# Patient Record
Sex: Female | Born: 1960
Health system: Southern US, Community
[De-identification: ages and names within clinical notes are randomized; demographics above are authoritative.]

## PROBLEM LIST (undated history)

## (undated) DIAGNOSIS — R519 Headache, unspecified: Secondary | ICD-10-CM

## (undated) DIAGNOSIS — B019 Varicella without complication: Secondary | ICD-10-CM

## (undated) DIAGNOSIS — R51 Headache: Secondary | ICD-10-CM

## (undated) DIAGNOSIS — K219 Gastro-esophageal reflux disease without esophagitis: Secondary | ICD-10-CM

## (undated) DIAGNOSIS — C801 Malignant (primary) neoplasm, unspecified: Secondary | ICD-10-CM

## (undated) DIAGNOSIS — I10 Essential (primary) hypertension: Secondary | ICD-10-CM

## (undated) DIAGNOSIS — N390 Urinary tract infection, site not specified: Secondary | ICD-10-CM

## (undated) DIAGNOSIS — D3911 Neoplasm of uncertain behavior of right ovary: Secondary | ICD-10-CM

## (undated) DIAGNOSIS — D649 Anemia, unspecified: Secondary | ICD-10-CM

## (undated) HISTORY — DX: Essential (primary) hypertension: I10

## (undated) HISTORY — DX: Varicella without complication: B01.9

## (undated) HISTORY — DX: Gastro-esophageal reflux disease without esophagitis: K21.9

## (undated) HISTORY — DX: Malignant (primary) neoplasm, unspecified: C80.1

## (undated) HISTORY — DX: Urinary tract infection, site not specified: N39.0

---
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
## 2006-05-11 ENCOUNTER — Ambulatory Visit: Payer: Self-pay | Admitting: Unknown Physician Specialty

## 2007-05-15 ENCOUNTER — Ambulatory Visit: Payer: Self-pay | Admitting: Unknown Physician Specialty

## 2007-07-07 ENCOUNTER — Emergency Department: Payer: Self-pay | Admitting: Emergency Medicine

## 2008-05-23 ENCOUNTER — Ambulatory Visit: Payer: Self-pay | Admitting: Unknown Physician Specialty

## 2009-05-25 ENCOUNTER — Ambulatory Visit: Payer: Self-pay | Admitting: Unknown Physician Specialty

## 2010-02-09 ENCOUNTER — Ambulatory Visit: Payer: Self-pay | Admitting: Orthopedic Surgery

## 2010-03-09 ENCOUNTER — Ambulatory Visit: Payer: Self-pay | Admitting: Family Medicine

## 2010-06-01 ENCOUNTER — Ambulatory Visit: Payer: Self-pay | Admitting: Unknown Physician Specialty

## 2011-01-23 ENCOUNTER — Ambulatory Visit: Payer: Self-pay | Admitting: Family Medicine

## 2011-06-09 ENCOUNTER — Ambulatory Visit: Payer: Self-pay | Admitting: Unknown Physician Specialty

## 2011-08-10 ENCOUNTER — Ambulatory Visit: Payer: Self-pay

## 2011-08-10 LAB — URINALYSIS, COMPLETE
Glucose,UR: NEGATIVE mg/dL (ref 0–75)
Ketone: NEGATIVE
Nitrite: NEGATIVE
Ph: 5 (ref 4.5–8.0)
Specific Gravity: >=1.03 (ref 1.003–1.030)

## 2011-08-11 LAB — URINE CULTURE

## 2013-02-01 ENCOUNTER — Ambulatory Visit: Payer: Self-pay | Admitting: Family Medicine

## 2013-03-27 ENCOUNTER — Ambulatory Visit (INDEPENDENT_AMBULATORY_CARE_PROVIDER_SITE_OTHER): Payer: 59 | Admitting: Internal Medicine

## 2013-03-27 ENCOUNTER — Encounter: Payer: Self-pay | Admitting: Internal Medicine

## 2013-03-27 VITALS — BP 136/80 | HR 74 | Temp 98.2°F | Resp 14 | Ht 67.0 in | Wt 145.5 lb

## 2013-03-27 DIAGNOSIS — Z0000 Encounter for general adult medical examination without abnormal findings: Secondary | ICD-10-CM

## 2013-03-27 NOTE — Progress Notes (Signed)
Patient ID: Carmen Ross, female   DOB: Mar 31, 1961, 52 y.o.   MRN: [id number]   Patient Active Problem List   Diagnosis Date Noted  . Routine general medical examination at a health care facility 03/28/2013    Subjective:  CC:   Chief Complaint  Patient presents with  . Establish Care    HPI:   Carmen Ross is a 52 y.o. female who presents as a new patient to establish primary care with the chief complaint of establish care. Patient was last seen in 2012 at my former practice.  No records are available at time of evaluation    She has been generally healthy but had several illnesses over the last several months which occurred in retrospect per patient, due to the stress of the school year. 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   In June she developed sinus pain and low-grade fevers which did not resolve after a week so she was treated at the Urgent Care in [facility]  for a  sinus infection  With augmentin and flonase.   her treatment for sinusitis was followed by the development of a   UTI treated  which she treated with OTC meds   following resolution of her urinary symptoms, she  developed multiple loose stools daily accompanied by  Post prandial nausea without vomiting. Micah Flesher to [facility] and suffered through it  With bland diet ,  Crackers,  Finally resolved after 10 to 14 days .   she notes that her bowels have returned to normal but she is less lactulose intolerant that she had been in the past.       Past Medical History  Diagnosis Date  . Chicken pox   . UTI (lower urinary tract infection)     Past Surgical History  Procedure Laterality Date  . Hernia repair  1968  . Cesarean section  1988    Family History  Problem Relation Age of Onset  . Arthritis Mother   .  Hypertension Mother   . Hyperlipidemia Father   . Heart disease Father   . Hypertension Father   . Diabetes Father   . Hypertension Maternal Grandmother   . Arthritis Maternal Grandmother   . Hypertension Maternal Grandfather     History   Social History  . Marital Status: Married    Spouse Name: N/A    Number of Children: N/A  . Years of Education: N/A   Occupational History  . Not on file.   Social History Main Topics  . Smoking status: Current Every Day Smoker -- 10.00 packs/day    Types: Cigarettes    Start date: 03/27/1982  . Smokeless tobacco: Never Used  . Alcohol Use: Yes     Comment: occasional very rare  . Drug Use: No  . Sexual Activity: Yes   Other Topics Concern  . Not on file   Social History Narrative  . No narrative on file    No Known Allergies   Review of Systems:   The remainder of the review of systems was negative except those addressed in the HPI.    Objective:  BP 136/80  Pulse 74  Temp(Src) 98.2 F (36.8 C) (Oral)  Resp 14  Ht 5\' 7"  (1.702 m)  Wt 145 lb 8 oz (65.998 kg)  BMI 22.78  kg/m2  SpO2 98%  LMP 02/19/2013  General appearance: alert, cooperative and appears stated age Ears: normal TM's and external ear canals both ears Throat: lips, mucosa, and tongue normal; teeth and gums normal Neck: no adenopathy, no carotid bruit, supple, symmetrical, trachea midline and thyroid not enlarged, symmetric, no tenderness/mass/nodules Back: symmetric, no curvature. ROM normal. No CVA tenderness. Lungs: clear to auscultation bilaterally Heart: regular rate and rhythm, S1, S2 normal, no murmur, click, rub or gallop Abdomen: soft, non-tender; bowel sounds normal; no masses,  no organomegaly Pulses: 2+ and symmetric Skin: Skin color, texture, turgor normal. No rashes or lesions Lymph nodes: Cervical, supraclavicular, and axillary nodes normal.  Assessment and Plan:  Routine general medical examination at a health care facility Annual  comprehensive exam was done excluding breast, pelvic and PAP smear. All screenings have been addressed . She is up-to-date and does not need a Pap smear until October 2015.   Updated Medication List Outpatient Encounter Prescriptions as of 03/27/2013  Medication Sig Dispense Refill  . Multiple Vitamins-Minerals (MULTIVITAMIN WITH MINERALS) tablet Take 1 tablet by mouth daily.       No facility-administered encounter medications on file as of 03/27/2013.     Orders Placed This Encounter  Procedures  . HM MAMMOGRAPHY  . HM PAP SMEAR    No Follow-up on file.

## 2013-03-27 NOTE — Patient Instructions (Addendum)
Return at your leisure for fasting labs  Consider daily use of Simply Saline to flush sinuses after work

## 2013-03-28 ENCOUNTER — Encounter: Payer: Self-pay | Admitting: Internal Medicine

## 2013-03-28 DIAGNOSIS — Z0000 Encounter for general adult medical examination without abnormal findings: Secondary | ICD-10-CM | POA: Insufficient documentation

## 2013-03-28 NOTE — Assessment & Plan Note (Signed)
Annual comprehensive exam was done excluding breast, pelvic and PAP smear. All screenings have been addressed . She is up-to-date and does not need a Pap smear until October 2015.

## 2013-04-02 ENCOUNTER — Other Ambulatory Visit: Payer: 59

## 2013-07-02 ENCOUNTER — Ambulatory Visit: Payer: 59 | Admitting: Adult Health

## 2013-07-03 ENCOUNTER — Ambulatory Visit (INDEPENDENT_AMBULATORY_CARE_PROVIDER_SITE_OTHER): Payer: 59 | Admitting: Internal Medicine

## 2013-07-03 ENCOUNTER — Ambulatory Visit: Payer: 59 | Admitting: Adult Health

## 2013-07-03 ENCOUNTER — Encounter: Payer: Self-pay | Admitting: Internal Medicine

## 2013-07-03 VITALS — BP 124/74 | HR 74 | Temp 98.0°F | Resp 12 | Ht 67.0 in | Wt 151.0 lb

## 2013-07-03 DIAGNOSIS — Z720 Tobacco use: Secondary | ICD-10-CM

## 2013-07-03 DIAGNOSIS — Z7189 Other specified counseling: Secondary | ICD-10-CM

## 2013-07-03 DIAGNOSIS — J069 Acute upper respiratory infection, unspecified: Secondary | ICD-10-CM

## 2013-07-03 DIAGNOSIS — F17200 Nicotine dependence, unspecified, uncomplicated: Secondary | ICD-10-CM

## 2013-07-03 DIAGNOSIS — Z716 Tobacco abuse counseling: Secondary | ICD-10-CM

## 2013-07-03 MED ORDER — AMOXICILLIN-POT CLAVULANATE 875-125 MG PO TABS: 1.0000 | ORAL_TABLET | Freq: Two times a day (BID) | ORAL | Status: DC

## 2013-07-03 MED ORDER — BENZONATATE 200 MG PO CAPS: 200.0000 mg | ORAL_CAPSULE | Freq: Three times a day (TID) | ORAL | Status: DC | PRN

## 2013-07-03 NOTE — Progress Notes (Signed)
Pre-visit discussion using our clinic review tool. No additional management support is needed unless otherwise documented below in the visit note.  

## 2013-07-03 NOTE — Patient Instructions (Addendum)
You have a viral  Syndrome .  The post nasal drip is causing your sore/scratchy throat.  Lavage your sinuses twice daily with Simply saline nasal spray.  Use benadryl 25 mg every 8 hours and Sudafed PE 10 to 30 mg every 8 hours to manage the drainage and congestion.  Gargle with salt water as often for the sore throat.  Use Delsym  For the cough syrup for the cough.  If the throat is no better  In 3 to 4 days OR  if you develop T > 100.4,  Green nasal discharge, ear or facial pain,  You can start the augmentin  Please take a probiotic ( Align, Flora que or [place]) while you are on the antibiotic to prevent a serious antibiotic associated diarrhea  Called clostirudium dificile colitis and a vaginal yeast infection   You can also use the tessalon cough capsules for severe cough

## 2013-07-05 ENCOUNTER — Encounter: Payer: Self-pay | Admitting: Internal Medicine

## 2013-07-05 DIAGNOSIS — J069 Acute upper respiratory infection, unspecified: Secondary | ICD-10-CM | POA: Insufficient documentation

## 2013-07-05 DIAGNOSIS — Z720 Tobacco use: Secondary | ICD-10-CM | POA: Insufficient documentation

## 2013-07-05 DIAGNOSIS — Z716 Tobacco abuse counseling: Secondary | ICD-10-CM | POA: Insufficient documentation

## 2013-07-05 NOTE — Progress Notes (Signed)
Patient ID: Carmen Ross, female   DOB: 1961-03-28, 52 y.o.   MRN: [id number]   Patient Active Problem List   Diagnosis Date Noted  . Acute URI of multiple sites 07/05/2013  . Tobacco abuse counseling 07/05/2013  . Tobacco abuse 07/05/2013  . Routine general medical examination at a health care facility 03/28/2013    Subjective:  CC:   Chief Complaint  Patient presents with  . Acute Visit    sinus pressure , scratchy throat no fever.  . Cough    sometimes productive, yellow mucus    HPI:   Carmen Ross a 52 y.o. female who presents with signs and symptoms of viral URI>  Scratchy throat started a week ago  Accompanied by dry cough.  For the last days having a lot of sinus pressure accompanied by sneezing and rhinorrhea with clear to yellow discharge.  Ears have been "popping" but not painful, no loss of hearing or discharge,  No fevers, nausea.  No recent travel.  Positive sick contacts at [employer].  Patient is a closet smoker .  Past Medical History  Diagnosis Date  . Chicken pox   . UTI (lower urinary tract infection)     Past Surgical History  Procedure Laterality Date  . Hernia repair  1968  . Cesarean section  1988       The following portions of the patient's history were reviewed and updated as appropriate: Allergies, current medications, and problem list.    Review of Systems:   12 Pt  review of systems was negative except those addressed in the HPI,     History   Social History  . Marital Status: Married    Spouse Name: N/A    Number of Children: N/A  . Years of Education: N/A   Occupational History  . Not on file.   Social History Main Topics  . Smoking status: Current Every Day Smoker -- 0.07 packs/day    Types: Cigarettes    Start date: 03/27/1982  . Smokeless tobacco: Never Used  . Alcohol Use: Yes     Comment: occasional very rare  . Drug Use: No  . Sexual Activity: Yes   Other Topics Concern  . Not on file   Social  History Narrative  . No narrative on file    Objective:  Filed Vitals:   07/03/13 1108  BP: 124/74  Pulse: 74  Temp: 98 F (36.7 C)  Resp: 12     General appearance: alert, cooperative and appears stated age Ears: normal TM's and external ear canals both ears Throat: lips, mucosa, and tongue normal; teeth and gums normal Neck: no adenopathy, no carotid bruit, supple, symmetrical, trachea midline and thyroid not enlarged, symmetric, no tenderness/mass/nodules Back: symmetric, no curvature. ROM normal. No CVA tenderness. Lungs: clear to auscultation bilaterally Heart: regular rate and rhythm, S1, S2 normal, no murmur, click, rub or gallop Abdomen: soft, non-tender; bowel sounds normal; no masses,  no organomegaly Pulses: 2+ and symmetric Skin: Skin color, texture, turgor normal. No rashes or lesions Lymph nodes: Cervical, supraclavicular, and axillary nodes normal.  Assessment and Plan:  Acute URI of multiple sites This URI is most likely viral given the failure of mild HEENT exam.  I have explained that in viral URIS, an antibiotic will not help the symptoms and will increase the risk of developing diarrhea.,  Continue oral and nasal decongestants,  Ibuprofen 400 mg and tylenol 650 mq 8 hrs for aches and pains,  And will [employer]  100 mg every 8 hours prn cough  Add abx only if symptoms worsen to include fevers, facial pain, purulent sputum./drainage.   Tobacco abuse counseling The patient was counseled on the dangers of tobacco use, and was advised to quit.  Reviewed strategies to maximize success, including removing cigarettes and smoking materials from environment.   Updated Medication List Outpatient Encounter Prescriptions as of 07/03/2013  Medication Sig  . Multiple Vitamins-Minerals (MULTIVITAMIN WITH MINERALS) tablet Take 1 tablet by mouth daily.  . phenylephrine (SUDAFED PE) 10 MG TABS tablet Take 10 mg by mouth every 4 (four) hours as needed.  Marland Kitchen  amoxicillin-clavulanate (AUGMENTIN) 875-125 MG per tablet Take 1 tablet by mouth 2 (two) times daily.  . benzonatate (TESSALON) 200 MG capsule Take 1 capsule (200 mg total) by mouth 3 (three) times daily as needed for cough.     No orders of the defined types were placed in this encounter.    No Follow-up on file.

## 2013-07-05 NOTE — Assessment & Plan Note (Addendum)
This URI is most likely viral given the failure of mild HEENT exam.  I have explained that in viral URIS, an antibiotic will not help the symptoms and will increase the risk of developing diarrhea.,  Continue oral and nasal decongestants,  Ibuprofen 400 mg and tylenol 650 mq 8 hrs for aches and pains,  And will addtessalon 100 mg every 8 hours prn cough  Add abx only if symptoms worsen to include fevers, facial pain, purulent sputum./drainage.

## 2013-07-05 NOTE — Assessment & Plan Note (Signed)
The patient was counseled on the dangers of tobacco use, and was advised to quit.  Reviewed strategies to maximize success, including removing cigarettes and smoking materials from environment. 

## 2014-08-13 ENCOUNTER — Observation Stay: Payer: Self-pay | Admitting: Specialist

## 2014-08-13 ENCOUNTER — Telehealth: Payer: Self-pay | Admitting: Internal Medicine

## 2014-08-13 LAB — URINALYSIS, COMPLETE
BLOOD: NEGATIVE
Bilirubin,UR: NEGATIVE
Glucose,UR: NEGATIVE mg/dL (ref 0–75)
Leukocyte Esterase: NEGATIVE
Nitrite: NEGATIVE
PROTEIN: NEGATIVE
Ph: 5 (ref 4.5–8.0)
RBC,UR: 1 /HPF (ref 0–5)
SPECIFIC GRAVITY: 1.028 (ref 1.003–1.030)
WBC UR: 1 /HPF (ref 0–5)

## 2014-08-13 LAB — TROPONIN I
TROPONIN-I: 0.16 ng/mL — AB
TROPONIN-I: 0.15 ng/mL — AB

## 2014-08-13 LAB — CK TOTAL AND CKMB (NOT AT ARMC)
CK, TOTAL: 60 U/L (ref 26–192)
CK, Total: 46 U/L (ref 26–192)
CK-MB: 1.5 ng/mL (ref 0.5–3.6)
CK-MB: 1.1 ng/mL (ref 0.5–3.6)

## 2014-08-13 LAB — BASIC METABOLIC PANEL
ANION GAP: 7 (ref 7–16)
BUN: 11 mg/dL (ref 7–18)
CALCIUM: 8.8 mg/dL (ref 8.5–10.1)
CHLORIDE: 106 mmol/L (ref 98–107)
CO2: 26 mmol/L (ref 21–32)
Creatinine: 0.78 mg/dL (ref 0.60–1.30)
EGFR (African American): >60
EGFR (Non-African Amer.): >60
Glucose: 89 mg/dL (ref 65–99)
OSMOLALITY: 276 (ref 275–301)
POTASSIUM: 3.7 mmol/L (ref 3.5–5.1)
Sodium: 139 mmol/L (ref 136–145)

## 2014-08-13 LAB — CBC WITH DIFFERENTIAL/PLATELET
Basophil #: 0.1 10*3/uL (ref 0.0–0.1)
Basophil %: 0.6 %
EOS PCT: 1.5 %
Eosinophil #: 0.1 10*3/uL (ref 0.0–0.7)
HCT: 27.9 % — AB (ref 35.0–47.0)
HGB: 8.5 g/dL — ABNORMAL LOW (ref 12.0–16.0)
Lymphocyte #: 1.8 10*3/uL (ref 1.0–3.6)
Lymphocyte %: 20.1 %
MCH: 20.2 pg — ABNORMAL LOW (ref 26.0–34.0)
MCHC: 30.5 g/dL — ABNORMAL LOW (ref 32.0–36.0)
MCV: 66 fL — ABNORMAL LOW (ref 80–100)
MONOS PCT: 6.2 %
Monocyte #: 0.6 x10 3/mm (ref 0.2–0.9)
NEUTROS ABS: 6.5 10*3/uL (ref 1.4–6.5)
Neutrophil %: 71.6 %
Platelet: 308 10*3/uL (ref 150–440)
RBC: 4.2 10*6/uL (ref 3.80–5.20)
RDW: 18 % — ABNORMAL HIGH (ref 11.5–14.5)
WBC: 9.1 10*3/uL (ref 3.6–11.0)

## 2014-08-13 NOTE — Telephone Encounter (Signed)
Patient Name: Carmen Ross  DOB: 02/15/1961    Nurse Assessment  Nurse: Mallie Mussel, RN, Alveta Heimlich Date/Time Eilene Ghazi Time): 08/13/2014 1:01:19 PM  Confirm and document reason for call. If symptomatic, describe symptoms. ---Caller states that she felt faint and dizzy when she first got up this morning. This lasted about 45 minutes to an hour. She has tingling in her fingers and toes. Denies difficulty breathing. She just "feels weird, like something is going on." She no longer feels dizzy. The left face of her face feels funny, but not numb. "it feels different" Denies difficulty walking, she is not slurring her words. Denies headache.  Has the patient traveled out of the country within the last 30 days? ---No  Does the patient require triage? ---Yes  Related visit to physician within the last 2 weeks? ---No  Does the PT have any chronic conditions? (i.e. diabetes, asthma, etc.) ---No  Did the patient indicate they were pregnant? ---No     Guidelines    Guideline Title Affirmed Question Affirmed Notes  Neurologic Deficit [1] Numbness (i.e., loss of sensation) of the face, arm or leg on one side of the body AND [2] gradual onset (e.g., days to weeks) AND [3] present now    Final Disposition User   See Physician within 4 Hours (or PCP triage) Mallie Mussel, RN, Alveta Heimlich

## 2014-08-13 NOTE — Telephone Encounter (Signed)
Please review in Dr Lupita Dawn absence

## 2014-08-13 NOTE — Telephone Encounter (Signed)
Called pt in attempt to advise her to go to ER or Urgent Care for evaluation however there was no answer.  Left message for pt to return call

## 2014-08-13 NOTE — Telephone Encounter (Signed)
Please see if she can be seen elsewhere since this will be longer than a 15 minute visit. Thanks!

## 2014-08-14 ENCOUNTER — Telehealth: Payer: Self-pay | Admitting: Internal Medicine

## 2014-08-14 DIAGNOSIS — I340 Nonrheumatic mitral (valve) insufficiency: Secondary | ICD-10-CM

## 2014-08-14 LAB — LIPID PANEL
Cholesterol: 157 mg/dL
HDL Cholesterol: 64 mg/dL — ABNORMAL HIGH
Ldl Cholesterol, Calc: 80 mg/dL
Triglycerides: 63 mg/dL
VLDL Cholesterol, Calc: 13 mg/dL

## 2014-08-14 LAB — CBC WITH DIFFERENTIAL/PLATELET
BASOS ABS: 0.1 10*3/uL (ref 0.0–0.1)
Basophil %: 0.7 %
Eosinophil #: 0.2 10*3/uL (ref 0.0–0.7)
Eosinophil %: 3.1 %
HCT: 24.6 % — ABNORMAL LOW (ref 35.0–47.0)
HGB: 7.5 g/dL — ABNORMAL LOW (ref 12.0–16.0)
LYMPHS PCT: 27.1 %
Lymphocyte #: 2 10*3/uL (ref 1.0–3.6)
MCH: 20 pg — AB (ref 26.0–34.0)
MCHC: 30.6 g/dL — AB (ref 32.0–36.0)
MCV: 65 fL — ABNORMAL LOW (ref 80–100)
MONO ABS: 0.7 x10 3/mm (ref 0.2–0.9)
Monocyte %: 10 %
NEUTROS PCT: 59.1 %
Neutrophil #: 4.4 10*3/uL (ref 1.4–6.5)
Platelet: 236 10*3/uL (ref 150–440)
RBC: 3.77 10*6/uL — AB (ref 3.80–5.20)
RDW: 18.1 % — AB (ref 11.5–14.5)
WBC: 7.5 10*3/uL (ref 3.6–11.0)

## 2014-08-14 LAB — IRON AND TIBC
Iron Bind.Cap.(Total): 460 ug/dL — ABNORMAL HIGH
Iron Saturation: 3 %
Iron: 16 ug/dL — ABNORMAL LOW
Unbound Iron-Bind.Cap.: 444 ug/dL

## 2014-08-14 LAB — CK TOTAL AND CKMB (NOT AT ARMC)
CK, Total: 44 U/L (ref 26–192)
CK-MB: 0.9 ng/mL (ref 0.5–3.6)

## 2014-08-14 LAB — BASIC METABOLIC PANEL
Anion Gap: 6 — ABNORMAL LOW (ref 7–16)
BUN: 11 mg/dL (ref 7–18)
Calcium, Total: 8.4 mg/dL — ABNORMAL LOW (ref 8.5–10.1)
Chloride: 106 mmol/L (ref 98–107)
Co2: 28 mmol/L (ref 21–32)
Creatinine: 0.92 mg/dL (ref 0.60–1.30)
EGFR (Non-African Amer.): >60
GLUCOSE: 93 mg/dL (ref 65–99)
OSMOLALITY: 278 (ref 275–301)
POTASSIUM: 3.4 mmol/L — AB (ref 3.5–5.1)
Sodium: 140 mmol/L (ref 136–145)

## 2014-08-14 LAB — FERRITIN: Ferritin (ARMC): 2 ng/mL — ABNORMAL LOW (ref 8–388)

## 2014-08-14 LAB — TROPONIN I: Troponin-I: 0.15 ng/mL — ABNORMAL HIGH

## 2014-08-14 LAB — FOLATE: Folic Acid: 10.7 ng/mL (ref 3.1–17.5)

## 2014-08-14 LAB — TSH: THYROID STIMULATING HORM: 1.96 u[IU]/mL

## 2014-08-14 NOTE — Telephone Encounter (Signed)
The patient was discharged from [facility] today she is needing a hospital follow up for syncope and iron deficiency .

## 2014-08-15 NOTE — Telephone Encounter (Signed)
Left message on patient voicemail to return call to office.

## 2014-08-18 ENCOUNTER — Telehealth: Payer: Self-pay | Admitting: *Deleted

## 2014-08-18 NOTE — Telephone Encounter (Signed)
Patient is scheduled with Lorane Gell NO @ 11.00 on 08/20/14.

## 2014-08-18 NOTE — Telephone Encounter (Signed)
Discharge date: 08/14/14  Transition Care Management Follow-up Telephone Call  How have you been since you were released from the hospital? Pt states "okay, a little dizzy this morning but went to work today and did okay, no issues"   Do you understand why you were in the hospital? YES   Do you understand the discharge instrcutions? YES  Items Reviewed:  Medications reviewed: YES, pt was started on Iron supplement   Allergies reviewed: NO  Dietary changes reviewed: NO  Referrals reviewed: n/a   Functional Questionnaire:   Activities of Daily Living (ADLs):   She states they are independent in the following:  States they require assistance with the following:    Any transportation issues/concerns?: NO   Any patient concerns? NO   Confirmed importance and date/time of follow-up visits scheduled: YES, Nov 13th at 66   Confirmed with patient if condition begins to worsen call PCP or go to the ER.  Patient was given the Call-a-Nurse line [phone]: YES

## 2014-08-18 NOTE — Telephone Encounter (Signed)
Can we schedule this patient 62 on Wednesday with Morey Hummingbird?

## 2014-08-20 ENCOUNTER — Encounter: Payer: Self-pay | Admitting: Nurse Practitioner

## 2014-08-20 ENCOUNTER — Ambulatory Visit (INDEPENDENT_AMBULATORY_CARE_PROVIDER_SITE_OTHER): Payer: 59 | Admitting: Nurse Practitioner

## 2014-08-20 VITALS — BP 138/70 | HR 78 | Temp 97.4°F | Resp 12 | Ht 67.0 in | Wt 155.4 lb

## 2014-08-20 DIAGNOSIS — Z09 Encounter for follow-up examination after completed treatment for conditions other than malignant neoplasm: Secondary | ICD-10-CM

## 2014-08-20 NOTE — Progress Notes (Signed)
Subjective:    Patient ID: Carmen Ross, female    DOB: 1961-02-27, 54 y.o.   MRN: [id number]  HPI Carmen Ross is a 54 yo female here for a hospital follow up.   1) Pre-syncopal- laid down felt better after feeling dizzy   Felt "like she wasn't there" when she went to work.  Called Triage.   Menstrual cycle still heavy- 10 days approx in length and heavy. Going through tampon/pad in 15 min. For approx 1 hour on heaviest day. Has uterine fibroids found on pelvic US in hospital. Also, saw small cyst on each ovary 2 cm and 3 cm.  Felt better yesterday.   2) Anal issue- DRE was negative in hospital, worse at school okay at home. Okay today.   Vasaline- not helpful   Preperation H- somewhat helpful  1/18th. Appointment with GYN Dr. Romelle Starcher. She will discuss these items with him as well.   Review of Systems  Constitutional: Negative for fever, chills, diaphoresis and fatigue.  Respiratory: Negative for chest tightness, shortness of breath and wheezing.   Cardiovascular: Negative for chest pain, palpitations and leg swelling.  Gastrointestinal: Negative for nausea, vomiting, abdominal pain, diarrhea, constipation, blood in stool, abdominal distention, anal bleeding and rectal pain.       Anal itching  Skin: Negative for rash.  Neurological: Negative for dizziness, weakness, numbness and headaches.  Psychiatric/Behavioral: The patient is not nervous/anxious.    Past Medical History  Diagnosis Date  . Chicken pox   . UTI (lower urinary tract infection)     History   Social History  . Marital Status: Married    Spouse Name: N/A    Number of Children: N/A  . Years of Education: N/A   Occupational History  . Not on file.   Social History Main Topics  . Smoking status: Current Every Day Smoker -- 0.07 packs/day    Types: Cigarettes    Start date: 03/27/1982  . Smokeless tobacco: Never Used  . Alcohol Use: Yes     Comment: occasional very rare  . Drug Use: No  .  Sexual Activity: Yes   Other Topics Concern  . Not on file   Social History Narrative    Past Surgical History  Procedure Laterality Date  . Hernia repair  1968  . Cesarean section  1988    Family History  Problem Relation Age of Onset  . Arthritis Mother   . Hypertension Mother   . Hyperlipidemia Father   . Heart disease Father   . Hypertension Father   . Diabetes Father   . Hypertension Maternal Grandmother   . Arthritis Maternal Grandmother   . Hypertension Maternal Grandfather     No Known Allergies  Current Outpatient Prescriptions on File Prior to Visit  Medication Sig Dispense Refill  . phenylephrine (SUDAFED PE) 10 MG TABS tablet Take 10 mg by mouth every 4 (four) hours as needed.     No current facility-administered medications on file prior to visit.      Objective:   Physical Exam  Constitutional: She is oriented to person, place, and time. She appears well-developed and well-nourished. No distress.  HENT:  Head: Normocephalic and atraumatic.  Right Ear: External ear normal.  Left Ear: External ear normal.  Neck: Normal range of motion. Neck supple. No thyromegaly present.  Cardiovascular: Normal rate, regular rhythm, normal heart sounds and intact distal pulses.  Exam reveals no gallop and no friction rub.   No murmur heard. Pulmonary/Chest:  Effort normal and breath sounds normal. No respiratory distress. She has no wheezes. She has no rales. She exhibits no tenderness.  Genitourinary:  Deferred anorectal exam to GYN seeing on 18th.   Lymphadenopathy:    She has no cervical adenopathy.  Neurological: She is alert and oriented to person, place, and time. No cranial nerve deficit. She exhibits normal muscle tone. Coordination normal.  Skin: Skin is warm and dry. No rash noted. She is not diaphoretic.  Psychiatric: She has a normal mood and affect. Her behavior is normal. Judgment and thought content normal.   BP 138/70 mmHg  Pulse 78  Temp(Src) 97.4  F (36.3 C) (Oral)  Resp 12  Ht 5\' 7"  (1.702 m)  Wt 155 lb 6.4 oz (70.489 kg)  BMI 24.33 kg/m2  SpO2 99%  LMP  (Approximate)     Assessment & Plan:

## 2014-08-20 NOTE — Patient Instructions (Addendum)
Please follow up with Dr. Altamease Oiler.   We will need a repeat blood count if they are not doing one at that visit.   Continue with Iron.

## 2014-08-20 NOTE — Progress Notes (Signed)
Pre visit review using our clinic review tool, if applicable. No additional management support is needed unless otherwise documented below in the visit note. 

## 2014-08-21 ENCOUNTER — Telehealth: Payer: Self-pay | Admitting: Internal Medicine

## 2014-08-21 NOTE — Telephone Encounter (Signed)
emmi emailed °

## 2014-08-22 DIAGNOSIS — Z09 Encounter for follow-up examination after completed treatment for conditions other than malignant neoplasm: Secondary | ICD-10-CM | POA: Insufficient documentation

## 2014-08-22 NOTE — Assessment & Plan Note (Signed)
Stable. Pt was found to be anemic. She was having heavy menstrual periods from fibroids found on pelvic US during visit. Pt on iron and to continue. She has FU appoint with GYN on 08/25/14. FU prn.

## 2014-08-22 NOTE — Addendum Note (Signed)
Addended by: Rubbie Battiest on: 08/22/2014 01:35 PM   Modules accepted: Level of Service

## 2014-11-10 ENCOUNTER — Emergency Department
Admit: 2014-11-10 | Disposition: A | Discharge status: Home / Self Care | Payer: Self-pay | Admitting: Emergency Medicine

## 2014-11-10 LAB — CBC
HCT: 42 % (ref 35.0–47.0)
HGB: 13.9 g/dL (ref 12.0–16.0)
MCH: 28.6 pg (ref 26.0–34.0)
MCHC: 33.2 g/dL (ref 32.0–36.0)
MCV: 86 fL (ref 80–100)
Platelet: 219 10*3/uL (ref 150–440)
RBC: 4.88 10*6/uL (ref 3.80–5.20)
RDW: 12.7 % (ref 11.5–14.5)
WBC: 9.8 10*3/uL (ref 3.6–11.0)

## 2014-12-07 NOTE — Discharge Summary (Signed)
PATIENT NAME:  Carmen Ross, CUTBIRTH MR#:  [id number] DATE OF BIRTH:  14-Nov-1960  DATE OF ADMISSION:  08/13/2014 DATE OF DISCHARGE:  08/14/2014  For a detailed note, please see the history and physical done on admission by Dr. Margaretmary Eddy.   DIAGNOSES AT DISCHARGE: Microcytic anemia secondary to iron deficiency. Dizziness due to the anemia, elevated troponin likely secondary to demand ischemia, menorrhagia due to fibroids.   DIET: The patient is being discharged on a regular diet.   ACTIVITY: As tolerated.   FOLLOWUP INSTRUCTIONS: Follow-up with Dr. Deborra Medina in the next 1-2 weeks. Also follow up with Dr. Ouida Sills from OB/GYN in the next 1-2 weeks.   DISCHARGE MEDICATION: Iron sulfate 325 mg b.i.d.   CONSULTANTS DURING THE HOSPITAL COURSE: Dr. Ouida Sills from OB/GYN.   PERTINENT STUDIES DONE DURING THE HOSPITAL COURSE: A CT scan of the head done without contrast on admission showing no acute intracranial abnormality. A chest x-ray done on admission showing no evidence of acute cardiopulmonary disease. A pelvic transvaginal ultrasound done showing thickened endometrial stripe measuring 17 mm. Uterine fibroids. No significant ovarian abnormalities. No free fluid or adnexal mass. A 2-dimensional echocardiogram done showing ejection fraction of 60% to 65%, normal global LV systolic function, mild mitral valve regurgitation, mild tricuspid regurgitation.   BRIEF HOSPITAL COURSE: This is a 54 year old female who presented to the hospital with a presyncopal episode and noted to be profoundly anemic.    1. Syncope/presyncope. The most likely cause of this was probably related to her severe iron deficiency anemia and symptomatic anemia. There was no evidence of any acute neurologic or cardiogenic source for her syncopal symptoms. Her CT head was negative. She had no arrhythmias on telemetry. She had an echocardiogram done, which showed no evidence of any wall motion abnormalities with normal ejection  fraction. The patient was not noted to be orthostatic. At present, the patient is being discharged on oral iron supplements for her severe iron deficiency anemia.  2. Microcytic anemia. This was likely secondary to severe iron deficiency anemia. The most likely cause of iron deficiency was due to heavy menorrhagia. At present, the patient is being discharged on iron supplements with follow-up with OB/GYN as an outpatient for her menorrhagia.  3. Menorrhagia. This was likely the cause of the patient's anemia. The patient's pelvic ultrasound showed fibroids with thickened endometrial stripe. An OB/GYN consult was obtained. The patient was seen by Dr. Ouida Sills, who recommended continue iron supplements with further work-up to be done as an outpatient.  4. Elevated troponin. This was likely a setting of demand ischemia from her severe anemia. She had no acute chest pain. Her cardiac markers remained stable did not trend upwards. Her echocardiogram done showed normal ejection fraction with no wall motion abnormalities.   CODE STATUS: The patient is a full code.   CONDITION: She is clinically asymptomatic presently with no dizziness. No chest pain, no shortness of breath, and therefore, being discharged home on iron supplements.    TIME SPENT: 40 minutes    ____________________________ Belia Heman. Verdell Carmine, MD [id number] D: 08/15/2014 08:36:06 ET T: 08/15/2014 11:37:45 ET JOB#: [id number]  cc: Belia Heman. Verdell Carmine, MD, <Dictator> Deborra Medina, MD Boykin Nearing, MD Henreitta Leber MD ELECTRONICALLY SIGNED 09/02/2014 10:39

## 2014-12-07 NOTE — Consult Note (Signed)
PATIENT NAME:  Carmen Ross, Carmen Ross MR#:  [id number] DATE OF BIRTH:  04-15-1961  DATE OF CONSULTATION:  08/14/2014  REFERRING PHYSICIAN:  A (Dictation Anomaly) <<Sainani,V>>   CONSULTING PHYSICIAN:  Boykin Nearing, MD  HISTORY OF PRESENT ILLNESS: A 54 year old gravida 4, para 2, patient presented to [facility] 36 hours previously with dizziness and feeling faint. The patient was admitted to [facility]. The patient has at least a 6 month history of menometrorrhagia with heavy flow. The patient states that she has had bleeding episodes, sometimes 2 in a month, and sometimes skipping periods. The patient's admitting hematocrit 27.9, today's hematocrit of 24.6. The patient underwent a pelvic ultrasound while in the hospital that showed uterus measuring 9 x 6 x 6 cm, with evidence of 3 fibroids, 1 measuring 3.4 x 3.8 cm, and number 2 measuring 4.8 x 3.7 cm, and number 3 measuring 2.2 x 2.3 cm, endometrial stripe was measured at 17 mm. The patient is a smoker, smokes 1/2 pack a day.   Patient's past Pap smears have been within normal limits, last Pap smear was in 2014.   PAST MEDICAL HISTORY: Unremarkable.   PAST SURGICAL HISTORY: C-section and nasal surgery for deviated septum, and inguinal hernia as an infant.   REVIEW OF SYSTEMS: Unremarkable.   FAMILY HISTORY: No gynecologic cancers.   ALLERGIES: No known drug allergies.   SOCIAL HISTORY: Smokes half pack a day and alcohol occasional.   PHYSICAL EXAMINATION: GENERAL: Well-developed, well-nourished, thin, white female.  VITAL SIGNS: Blood pressure 112/64, pulse of 90, respirations 18, temperature 97.8.  LUNGS: Clear to auscultation.  CARDIOVASCULAR: Regular rate and rhythm.  ABDOMEN: Soft and nontender.  PELVIC: Deferred.   LABORATORY STUDIES: Metabolic panel: Sodium 660, potassium 3.4, chloride 106, CO2 of 28, BUN and creatinine 11 and 0.9 respectively; CBC white blood count 7.5, hematocrit  24.6, platelets 236,000, MCV of 65, and TIBC of 460.   ASSESSMENT:  1.  Menometrorrhagia secondary to perimenopausal state.  2.  Severe anemia with microcytic anemia secondary to menorrhagia.  3.  Thickened endometrial stripe less clinically significant since she is still menstruating.  4.  Hypokalemia.   PLANS: Recommend the patient follow-up with me at [facility] for pelvic examination and endometrial biopsy given the patient is a tobacco user, she will not be a candidate for combination birth control pills to control bleeding; however, progesterone may be used to stabilize bleeding. If patient fails progesterone therapy, strongly recommend surgical intervention be consider by the patient.  These issues should be spelled out more completely after further evaluation.     ____________________________ Boykin Nearing, MD tjs:nt D: 08/14/2014 15:46:53 ET T: 08/14/2014 15:58:05 ET JOB#: [id number]  cc: Boykin Nearing, MD, <Dictator> Boykin Nearing MD ELECTRONICALLY SIGNED 08/15/2014 9:20

## 2014-12-07 NOTE — H&P (Signed)
PATIENT NAME:  Carmen Ross, SEELIG MR#:  [id number] DATE OF BIRTH:  09/16/1960  DATE OF ADMISSION:  08/13/2014     REFERRING EMERGENCY ROOM  PHYSICIAN: Dr. Jimmye Norman.  CHIEF COMPLAINT:  Dizziness.   HISTORY OF PRESENT ILLNESS: The patient is a 54 year old healthy female presenting to the ED with a chief complaint of dizziness. The patient is reporting that she had approximately 45 minutes of dizziness this morning at around 5:45 a.m. and lasted until 6:30 a.m. Denies any chest pain, shortness of breath and headache at that time. Denies any loss of consciousness. No similar complaints in the past. The patient was concerned about dizziness and came into the  ED. In the ED her initial troponin is elevated, at 0.16. EKG did not reveal any changes.  ER physician has recommended to admit the patient. The patient is reporting that she has been experiencing heavy bleeding May and is supposed to follow up with GYN but so far she did not have a chance to follow up with the GYN and no workup was done. She denies any heart attacks. No other complaints.   PAST MEDICAL HISTORY: Menorrhagia   PAST SURGICAL HISTORY: C-section.  ALLERGIES: No known drug allergies.   PSYCHOSOCIAL HISTORY: Lives at home with her husband. Smokes half pack a day for the past 30 years. Denies alcohol or illicit drug usage.   HOME MEDICATIONS: Not on any home medications.   FAMILY HISTORY: Dad has history of CHF. Coronary artery disease,  diabetes mellitus and hypertension runs in her family.   REVIEW OF SYSTEMS:  CONSTITUTIONAL: Denies any fever. Complaining of fatigue and weakness.  EYES: Denies blurry vision, double vision.  EARS, NOSE AND THROAT: Denies epistaxis, discharge. PULMONARY: Denies cough or COPD.  CARDIOVASCULAR: No chest pain, palpitation, had near syncope this morning.  GASTROINTESTINAL: Denies nausea, vomiting, diarrhea, abdominal pain.  GENITOURINARY: No dysuria or hematuria.  GYNECOLOGIC: Complaining of  menorrhagia.  ENDOCRINE: Denies polyuria, nocturia, thyroid problems.  HEMATOLOGIC AND LYMPHATIC: Has anemia, denies easy bruising or bleeding.  INTEGUMENTARY: No  rash, or lesions. MUSCULOSKELETAL: No joint pain in the neck or back. Denies any gout.  NEUROLOGIC: Denies vertigo, ataxia.  PSYCHIATRIC: No ADD or OCD.  PHYSICAL EXAMINATION:  VITAL SIGNS: Template 97.8, pulse 87, respirations 18, blood pressure 126/78 GENERAL:  Well built and developed  female in no apparent distress.  HEENT: Normocephalic, atraumatic. Pupils are equally reactive to light and accommodation. Pale conjunctivae.  NECK: Supple. No JVD. No thyromegaly. Range of motion is intact. LUNGS: With some bronchial breath sounds with no crackles and no wheezing. CARDIAC: S1 and S2. Regular rate and rhythm.  GASTROINTESTINAL: Soft. Bowel sounds are positive in all 4 quadrants. Nontender, nondistended. No hepatosplenomegaly. No masses felt.  NEUROLOGIC: Awake, alert, oriented x 3. Cranial nerves II through XII are grossly intact. Motor and sensory are intact. Reflexes are 2+. EXTREMITIES: No edema. No cyanosis. No clubbing. SKIN: Warm to touch. Normal turgor. No rashes. No lesions.  MUSCULOSKELETAL: No joint effusions or tenderness.   PSYCHIATRIC: Normal mood and affect.  IMAGING STUDIES: CT head is pending. Echocardiogram is ordered, which is pending. Pelvic ultrasound is ordered, which is pending. Chest x-ray is done; report is pending.   Troponin 0.16. BMP is normal. CBC: WBC normal. Hemoglobin 8.5, hematocrit 27.9, platelets are 308,000; MCV 866. Urinalysis: Nitrites negative, leukocyte esterase negative, color yellow, apparently normal urinalysis.   ASSESSMENT AND PLAN: A 54 year old Caucasian female presented to the ED with a chief complaint of near syncope.  This morning she experienced dizziness for 45 minutes with no chest pain, shortness of breath and no other complaints. Has been experiencing menorrhagia for the past  6-7 months and not evaluated by GYN yet. 1. Near syncope with elevated troponin and history of menorrhagia. We will admit her to telemetry. CT head is ordered, which is pending. Echocardiogram is ordered, which is pending. Pelvic ultrasound is ordered, which is pending. Digital rectal examination, performed by the ER physician, Dr. Cephas Darby is negative. We will cycle cardiac biomarkers. The patient will be monitored on telemetry for any kind of arrhythmia, which is  unlikely. If pelvic ultrasound is abnormal, we will consider GYN consult; otherwise, the patient needs to follow up  with GYN as an outpatient. We will get orthostatic vital signs. We will get neurologic checks as well.  2. Elevated troponin with no chest pain, but comes with near syncope. We will rule out acute myocardial infarction. Monitor her on telemetry and cycle cardiac biomarkers. The patient will be on ACS protocol. Lovenox 1 mg/kg is ordered in the ED which will be given after CT report is evaluated, if CT report is normal.  3. Microcytic anemia, probably from menorrhagia. We will get pelvic ultrasound. The patient needs iron supplements. Needs GYN evaluation. Based on the pelvic ultrasound report, if it is normal, we will recommended outpatient follow up as this is most likely perimenopausal bleeding.  4. Smoking. The patient was counseled to quit smoking for 3 to 4 minutes. She verbalized understanding. We will provide nicotine patch if troponins are negative.   The diagnosis and plan of care was discussed in detail with the patient and her husband at bedside. They both verbalized understanding of the plan.   TOTAL TIME SPENT ON ADMISSION: 45 minutes.   She is full code. Husband is the medical power of attorney.    ____________________________ Nicholes Mango, MD ag:ST D: 08/13/2014 18:22:46 ET T: 08/13/2014 19:34:17 ET JOB#: [id number]  cc: Nicholes Mango, MD, <Dictator> Unknown CC Nicholes Mango MD ELECTRONICALLY SIGNED  08/18/2014 23:00

## 2015-01-27 LAB — HM COLONOSCOPY

## 2015-11-05 ENCOUNTER — Other Ambulatory Visit: Payer: Self-pay | Admitting: Obstetrics and Gynecology

## 2015-11-05 DIAGNOSIS — Z1231 Encounter for screening mammogram for malignant neoplasm of breast: Secondary | ICD-10-CM

## 2015-11-16 ENCOUNTER — Ambulatory Visit
Admission: RE | Admit: 2015-11-16 | Discharge: 2015-11-16 | Disposition: A | Discharge status: Home / Self Care | Payer: 59 | Source: Ambulatory Visit | Attending: Obstetrics and Gynecology | Admitting: Obstetrics and Gynecology

## 2015-11-16 DIAGNOSIS — Z1231 Encounter for screening mammogram for malignant neoplasm of breast: Secondary | ICD-10-CM | POA: Diagnosis not present

## 2016-02-15 ENCOUNTER — Encounter: Payer: Self-pay | Admitting: Internal Medicine

## 2016-02-15 ENCOUNTER — Ambulatory Visit (INDEPENDENT_AMBULATORY_CARE_PROVIDER_SITE_OTHER): Payer: 59 | Admitting: Internal Medicine

## 2016-02-15 VITALS — BP 132/84 | HR 85 | Temp 98.1°F | Resp 12 | Ht 67.0 in | Wt 165.2 lb

## 2016-02-15 DIAGNOSIS — E785 Hyperlipidemia, unspecified: Secondary | ICD-10-CM | POA: Diagnosis not present

## 2016-02-15 DIAGNOSIS — Z862 Personal history of diseases of the blood and blood-forming organs and certain disorders involving the immune mechanism: Secondary | ICD-10-CM | POA: Diagnosis not present

## 2016-02-15 DIAGNOSIS — R1013 Epigastric pain: Secondary | ICD-10-CM | POA: Diagnosis not present

## 2016-02-15 DIAGNOSIS — E559 Vitamin D deficiency, unspecified: Secondary | ICD-10-CM

## 2016-02-15 DIAGNOSIS — D500 Iron deficiency anemia secondary to blood loss (chronic): Secondary | ICD-10-CM

## 2016-02-15 NOTE — Patient Instructions (Signed)
I recommend taking OTC prilosec (available OTC as omeprazole) on days you are going to take Aleve or Motrin   Keep Mylanta Gas on hand for your next episode of abdominal pain.  It will treat gastritis AND GAS    Anusol HC is available in suppository form to treat itchy irritated internal hemorrhoids   We will order an ultrasound of your abdomen to evaluate gallbladder and pancreas

## 2016-02-15 NOTE — Progress Notes (Signed)
Subjective:  Patient ID: Carmen Ross, female    DOB: 1961/08/06  Age: 55 y.o. MRN: [id number]  CC: The primary encounter diagnosis was History of anemia. Diagnoses of Abdominal pain, epigastric, Vitamin D deficiency, Hyperlipidemia, and Anemia due to chronic blood loss were also pertinent to this visit.  HPI Carmen Ross presents for evaluation of abdominal pain.  Last visit with me Nov 2014  Cc;  Episode of epigastric pain that occurred two weeks ago.  Was not brought on by  Eating, but was accompanied by early satiety . Pain was constant for 3 days, worse with movement.Described as sharp,  Not relieved with vomiting or stooling.  Had not been drinking alcohol or using NSAIDs.  Spent 3 days lying in bed.  N o recurrence since stopping a natural supplement she had started taking he week before she developed the pain.    Had colonoscopy April 2016 by Vira Agar after admission for severe IDA hgb of 6 .NOT TRANSFUSED DESPITE ELEVATED TROPONIN .  Resolved by April 2016 ON TWICE DAILY IRON.   History of Anemia attributed to fibroid uterus .  Last GYN appt 10/2015 bilateral COMPLEX ovarian cysts. , enlarging by last [place]  Ca 125 WAS NORMAL AT 11  6 MONTH [place] and pelvic exam  ADVISED BY gyn (sEPT 2017)      Outpatient Prescriptions Prior to Visit  Medication Sig Dispense Refill  . phenylephrine (SUDAFED PE) 10 MG TABS tablet Take 10 mg by mouth every 4 (four) hours as needed.     No facility-administered medications prior to visit.    Review of Systems;  Patient denies headache, fevers, malaise, unintentional weight loss, skin rash, eye pain, sinus congestion and sinus pain, sore throat, dysphagia,  hemoptysis , cough, dyspnea, wheezing, chest pain, palpitations, orthopnea, edema, abdominal pain, nausea, melena, diarrhea, constipation, flank pain, dysuria, hematuria, urinary  Frequency, nocturia, numbness, tingling, seizures,  Focal weakness, Loss of consciousness,  Tremor, insomnia, depression,  anxiety, and suicidal ideation.      Objective:  BP 132/84 mmHg  Pulse 85  Temp(Src) 98.1 F (36.7 C) (Oral)  Resp 12  Ht 5\' 7"  (1.702 m)  Wt 165 lb 4 oz (74.957 kg)  BMI 25.88 kg/m2  SpO2 97%  LMP 05/09/2015  BP Readings from Last 3 Encounters:  02/15/16 132/84  08/20/14 138/70  07/03/13 124/74    Wt Readings from Last 3 Encounters:  02/15/16 165 lb 4 oz (74.957 kg)  08/20/14 155 lb 6.4 oz (70.489 kg)  07/03/13 151 lb (68.493 kg)    General appearance: alert, cooperative and appears stated age Ears: normal TM's and external ear canals both ears Throat: lips, mucosa, and tongue normal; teeth and gums normal Neck: no adenopathy, no carotid bruit, supple, symmetrical, trachea midline and thyroid not enlarged, symmetric, no tenderness/mass/nodules Back: symmetric, no curvature. ROM normal. No CVA tenderness. Lungs: clear to auscultation bilaterally Heart: regular rate and rhythm, S1, S2 normal, no murmur, click, rub or gallop Abdomen: soft, non-tender; bowel sounds normal; no masses,  no organomegaly Pulses: 2+ and symmetric Skin: Skin color, texture, turgor normal. No rashes or lesions Lymph nodes: Cervical, supraclavicular, and axillary nodes normal.  No results found for: HGBA1C  Lab Results  Component Value Date   CREATININE 0.92 08/14/2014   CREATININE 0.78 08/13/2014    Lab Results  Component Value Date   WBC 9.8 11/10/2014   HGB 13.9 11/10/2014   HCT 42.0 11/10/2014   PLT 219 11/10/2014   GLUCOSE 93  08/14/2014   CHOL 157 08/14/2014   TRIG 63 08/14/2014   HDL 64* 08/14/2014   LDLCALC 80 08/14/2014   NA 140 08/14/2014   K 3.4* 08/14/2014   CL 106 08/14/2014   CREATININE 0.92 08/14/2014   BUN 11 08/14/2014   CO2 28 08/14/2014   TSH 1.96 08/14/2014    Mm Digital Screening Bilateral  11/17/2015  CLINICAL DATA:  Screening. EXAM: DIGITAL SCREENING BILATERAL MAMMOGRAM WITH CAD COMPARISON:  Previous exam(s). ACR Breast Density Category c: The breast  tissue is heterogeneously dense, which may obscure small masses. FINDINGS: There are no findings suspicious for malignancy. Images were processed with CAD. IMPRESSION: No mammographic evidence of malignancy. A result letter of this screening mammogram will be mailed directly to the patient. RECOMMENDATION: Screening mammogram in one year. (Code:SM-B-01Y) BI-RADS CATEGORY  1: Negative. Electronically Signed   By: Evangeline Dakin M.D.   On: 11/17/2015 08:05    Assessment & Plan:   Problem List Items Addressed This Visit    Abdominal pain, epigastric    HISTORY SUGGESTIVE OF PANCREATITIS vs gaseous distension from medication side effect . Checking [place], lipase and CMET.  Trial of PPI and prn gaviscon/mylanta.  No results found for: LIPASE       Relevant Orders   H. pylori antibody, IgG   Comprehensive metabolic panel   Lipase   US Abdomen Complete   Anemia due to chronic blood loss    Severe, secondary to menorrhagia from multiple fibroids. With drop to 8,5 symptomatic with dizziness.  admitted Jan 2016,  No transfusion given.  Resolved with iron .  Colonoscopy done April 2016  Lab Results  Component Value Date   WBC 9.8 11/10/2014   HGB 13.9 11/10/2014   HCT 42.0 11/10/2014   MCV 86 11/10/2014   PLT 219 11/10/2014         Relevant Medications   ferrous sulfate 325 (65 FE) MG tablet    Other Visit Diagnoses    History of anemia    -  Primary    Relevant Orders    TSH    CBC with Differential/Platelet    Vitamin D deficiency        Relevant Orders    VITAMIN D 25 Hydroxy (Vit-D Deficiency, Fractures)    Hyperlipidemia        Relevant Orders    Lipid panel     A total of 40 minutes was spent with patient more than half of which was spent in counseling patient on the above mentioned issues , reviewing and explaining recent labs and imaging studies done, and coordination of care.  I am having Ms. Trickey maintain her phenylephrine and ferrous sulfate.  Meds ordered this  encounter  Medications  . ferrous sulfate 325 (65 FE) MG tablet    Sig: Take 325 mg by mouth daily with breakfast.     Refill:  3    There are no discontinued medications.  Follow-up: No Follow-up on file.   Crecencio Mc, MD

## 2016-02-15 NOTE — Progress Notes (Signed)
Pre-visit discussion using our clinic review tool. No additional management support is needed unless otherwise documented below in the visit note.  

## 2016-02-16 DIAGNOSIS — D500 Iron deficiency anemia secondary to blood loss (chronic): Secondary | ICD-10-CM | POA: Insufficient documentation

## 2016-02-16 DIAGNOSIS — R1013 Epigastric pain: Secondary | ICD-10-CM | POA: Insufficient documentation

## 2016-02-16 NOTE — Assessment & Plan Note (Addendum)
HISTORY SUGGESTIVE OF PANCREATITIS vs gaseous distension from medication side effect . Checking [place], lipase and CMET.  Trial of PPI and prn gaviscon/mylanta.  No results found for: LIPASE

## 2016-02-16 NOTE — Assessment & Plan Note (Addendum)
Severe, secondary to menorrhagia from multiple fibroids. With drop to 8,5 symptomatic with dizziness.  admitted Jan 2016,  No transfusion given.  Resolved with iron .  Colonoscopy done April 2016  Lab Results  Component Value Date   WBC 9.8 11/10/2014   HGB 13.9 11/10/2014   HCT 42.0 11/10/2014   MCV 86 11/10/2014   PLT 219 11/10/2014

## 2016-02-19 ENCOUNTER — Encounter: Payer: Self-pay | Admitting: Internal Medicine

## 2016-02-19 ENCOUNTER — Ambulatory Visit
Admission: RE | Admit: 2016-02-19 | Discharge: 2016-02-19 | Disposition: A | Discharge status: Home / Self Care | Payer: 59 | Source: Ambulatory Visit | Attending: Internal Medicine | Admitting: Internal Medicine

## 2016-02-19 DIAGNOSIS — R1013 Epigastric pain: Secondary | ICD-10-CM | POA: Insufficient documentation

## 2016-03-07 NOTE — Telephone Encounter (Signed)
Mailed unread message to patient, thanks 

## 2016-03-12 LAB — COMPREHENSIVE METABOLIC PANEL
ALK PHOS: 85 IU/L (ref 39–117)
ALT: 12 IU/L (ref 0–32)
AST: 14 IU/L (ref 0–40)
Albumin/Globulin Ratio: 1.7 (ref 1.2–2.2)
Albumin: 4.7 g/dL (ref 3.5–5.5)
BILIRUBIN TOTAL: 0.5 mg/dL (ref 0.0–1.2)
BUN/Creatinine Ratio: 14 (ref 9–23)
BUN: 11 mg/dL (ref 6–24)
CHLORIDE: 98 mmol/L (ref 96–106)
CO2: 26 mmol/L (ref 18–29)
Calcium: 10.3 mg/dL — ABNORMAL HIGH (ref 8.7–10.2)
Creatinine, Ser: 0.76 mg/dL (ref 0.57–1.00)
GFR calc Af Amer: 102 mL/min/{1.73_m2} (ref >59)
GFR calc non Af Amer: 89 mL/min/{1.73_m2} (ref >59)
Globulin, Total: 2.8 g/dL (ref 1.5–4.5)
Glucose: 87 mg/dL (ref 65–99)
POTASSIUM: 4.2 mmol/L (ref 3.5–5.2)
Sodium: 142 mmol/L (ref 134–144)
Total Protein: 7.5 g/dL (ref 6.0–8.5)

## 2016-03-12 LAB — LIPID PANEL
CHOL/HDL RATIO: 2.9 ratio (ref 0.0–4.4)
Cholesterol, Total: 231 mg/dL — ABNORMAL HIGH (ref 100–199)
HDL: 79 mg/dL (ref >39)
LDL Calculated: 140 mg/dL — ABNORMAL HIGH (ref 0–99)
Triglycerides: 58 mg/dL (ref 0–149)
VLDL Cholesterol Cal: 12 mg/dL (ref 5–40)

## 2016-03-12 LAB — CBC WITH DIFFERENTIAL/PLATELET
Basophils Absolute: 0 10*3/uL (ref 0.0–0.2)
Basos: 0 %
EOS (ABSOLUTE): 0.3 10*3/uL (ref 0.0–0.4)
Eos: 4 %
Hematocrit: 41.9 % (ref 34.0–46.6)
Hemoglobin: 14.8 g/dL (ref 11.1–15.9)
IMMATURE GRANULOCYTES: 0 %
Immature Grans (Abs): 0 10*3/uL (ref 0.0–0.1)
LYMPHS ABS: 2.3 10*3/uL (ref 0.7–3.1)
Lymphs: 26 %
MCH: 30.1 pg (ref 26.6–33.0)
MCHC: 35.3 g/dL (ref 31.5–35.7)
MCV: 85 fL (ref 79–97)
MONOS ABS: 0.6 10*3/uL (ref 0.1–0.9)
Monocytes: 7 %
Neutrophils Absolute: 5.8 10*3/uL (ref 1.4–7.0)
Neutrophils: 63 %
PLATELETS: 248 10*3/uL (ref 150–379)
RBC: 4.91 x10E6/uL (ref 3.77–5.28)
RDW: 13.7 % (ref 12.3–15.4)
WBC: 9.1 10*3/uL (ref 3.4–10.8)

## 2016-03-12 LAB — H. PYLORI ANTIBODY, IGG: H PYLORI IGG: 2.9 U/mL — AB (ref 0.0–0.8)

## 2016-03-12 LAB — LIPASE: Lipase: 34 U/L (ref 0–59)

## 2016-03-12 LAB — TSH: TSH: 1.21 u[IU]/mL (ref 0.450–4.500)

## 2016-03-12 LAB — VITAMIN D 25 HYDROXY (VIT D DEFICIENCY, FRACTURES): VIT D 25 HYDROXY: 49.4 ng/mL (ref 30.0–100.0)

## 2016-03-15 ENCOUNTER — Encounter: Payer: Self-pay | Admitting: *Deleted

## 2016-03-15 ENCOUNTER — Other Ambulatory Visit: Payer: Self-pay | Admitting: Internal Medicine

## 2016-03-15 DIAGNOSIS — B9681 Helicobacter pylori [H. pylori] as the cause of diseases classified elsewhere: Secondary | ICD-10-CM | POA: Insufficient documentation

## 2016-03-15 DIAGNOSIS — K2970 Gastritis, unspecified, without bleeding: Principal | ICD-10-CM

## 2016-03-15 HISTORY — DX: Helicobacter pylori (H. pylori) as the cause of diseases classified elsewhere: B96.81

## 2016-03-15 MED ORDER — CLARITHROMYCIN 250 MG PO TABS: 500.0000 mg | ORAL_TABLET | Freq: Two times a day (BID) | ORAL | 0 refills | Status: DC

## 2016-03-15 MED ORDER — OMEPRAZOLE 40 MG PO CPDR: 40.0000 mg | DELAYED_RELEASE_CAPSULE | Freq: Two times a day (BID) | ORAL | 0 refills | Status: DC

## 2016-03-15 MED ORDER — AMOXICILLIN 500 MG PO CAPS
1000.0000 mg | ORAL_CAPSULE | Freq: Two times a day (BID) | ORAL | 0 refills | Status: DC
Start: 2016-03-15 — End: 2016-04-27

## 2016-04-27 ENCOUNTER — Telehealth: Payer: Self-pay | Admitting: Internal Medicine

## 2016-04-27 ENCOUNTER — Ambulatory Visit (INDEPENDENT_AMBULATORY_CARE_PROVIDER_SITE_OTHER): Payer: 59 | Admitting: Family Medicine

## 2016-04-27 ENCOUNTER — Encounter: Payer: Self-pay | Admitting: Family Medicine

## 2016-04-27 VITALS — BP 134/89 | HR 88 | Temp 98.6°F | Ht 67.0 in | Wt 163.5 lb

## 2016-04-27 DIAGNOSIS — Z720 Tobacco use: Secondary | ICD-10-CM | POA: Diagnosis not present

## 2016-04-27 DIAGNOSIS — R062 Wheezing: Secondary | ICD-10-CM

## 2016-04-27 DIAGNOSIS — J0100 Acute maxillary sinusitis, unspecified: Secondary | ICD-10-CM

## 2016-04-27 MED ORDER — AMOXICILLIN 500 MG PO CAPS: 1000.0000 mg | ORAL_CAPSULE | Freq: Two times a day (BID) | ORAL | 0 refills | Status: AC

## 2016-04-27 MED ORDER — FLUCONAZOLE 150 MG PO TABS: 150.0000 mg | ORAL_TABLET | Freq: Once | ORAL | 0 refills | Status: AC

## 2016-04-27 NOTE — Progress Notes (Signed)
Pre visit review using our clinic review tool, if applicable. No additional management support is needed unless otherwise documented below in the visit note. 

## 2016-04-27 NOTE — Telephone Encounter (Signed)
Patient Name: Carmen Ross DOB: 11/30/1960 Initial Comment Has a low grade fever, feels achy, and nasal congestion, concerned about sinus infection Nurse Assessment Nurse: Ronnald Ramp, RN, Miranda Date/Time (Eastern Time): 04/27/2016 10:09:02 AM Confirm and document reason for call. If symptomatic, describe symptoms. You must click the next button to save text entered. ---Caller states she has been having congestion and low grade fever for 1-2 weeks. Temp 99.5 Has the patient traveled out of the country within the last 30 days? ---No Does the patient have any new or worsening symptoms? ---Yes Will a triage be completed? ---Yes Related visit to physician within the last 2 weeks? ---No Does the PT have any chronic conditions? (i.e. diabetes, asthma, etc.) ---No Is this a behavioral health or substance abuse call? ---No Guidelines Guideline Title Affirmed Question Affirmed Notes Sinus Pain or Congestion Fever present > 3 days (72 hours) Final Disposition User See Physician within 24 Hours Ronnald Ramp, Therapist, sports, Miranda Comments No appt available with PCP or primary office today . Caller states she would like to be seen this afternoon because she cannot go tomorrow morning. Appt scheduled for 2pm today at [facility] with Dr. Edilia Bo Referrals GO TO FACILITY OTHER - SPECIFY Disagree/Comply: Comply

## 2016-04-27 NOTE — Telephone Encounter (Signed)
FYI: pt is scheduled to be seen at [facility]

## 2016-04-27 NOTE — Progress Notes (Signed)
Dr. Frederico Hamman T. Suresh Audi, MD, [facility] [place], [address] Phone: [phone] Fax: [phone]  04/27/2016  Patient: Carmen Ross, MRN: [id number], DOB: [birth year], 55 y.o.  Primary Physician:  Crecencio Mc, MD   Chief Complaint  Patient presents with  . Nasal Congestion    x 8 days  . Generalized Body Aches  . Headache  . Cough  . Scratchy Throat  . Fever   Subjective:   Carmen Ross is a 55 y.o. very pleasant female patient who presents with the following:  About of stuff all over face. 8th day now, not getting better. Feeling achy and grumbly. Not much in the chest. Gglobally, the patient is fairly healthy, but she does smoke A pack a day and has done so for many years.  She is has some generalized sweating and achiness, but she has not measured a temperature greater than 100.4.  She has had some worsening of the pain in her maxillary sinus region.  She is not having a cough that is productive of any significant sputum.  Felt achy and hot.  1/2 pack a day.  Middle school history at the [employer].   She never uses any type of inhaler.  Past Medical History, Surgical History, Social History, Family History, Problem List, Medications, and Allergies have been reviewed and updated if relevant.  Patient Active Problem List   Diagnosis Date Noted  . Helicobacter pylori gastritis 03/15/2016  . Abdominal pain, epigastric 02/16/2016  . Anemia due to chronic blood loss 02/16/2016  . Hospital discharge follow-up 08/22/2014  . Tobacco abuse counseling 07/05/2013  . Tobacco abuse 07/05/2013  . Routine general medical examination at a health care facility 03/28/2013    Past Medical History:  Diagnosis Date  . Chicken pox   . UTI (lower urinary tract infection)     Past Surgical History:  Procedure Laterality Date  . CESAREAN SECTION  1988  . HERNIA REPAIR  1968    Social History   Social  History  . Marital status: Married    Spouse name: N/A  . Number of children: N/A  . Years of education: N/A   Occupational History  . Not on file.   Social History Main Topics  . Smoking status: Current Every Day Smoker    Packs/day: 0.07    Types: Cigarettes    Start date: 03/27/1982  . Smokeless tobacco: Never Used  . Alcohol use Yes     Comment: occasional very rare  . Drug use: No  . Sexual activity: Yes   Other Topics Concern  . Not on file   Social History Narrative  . No narrative on file    Family History  Problem Relation Age of Onset  . Arthritis Mother   . Hypertension Mother   . Hyperlipidemia Father   . Heart disease Father   . Hypertension Father   . Diabetes Father   . Hypertension Maternal Grandmother   . Arthritis Maternal Grandmother   . Hypertension Maternal Grandfather     No Known Allergies  Medication list reviewed and updated in full in [facility].  ROS: GEN: Acute illness details above GI: Tolerating PO intake GU: maintaining adequate hydration and urination Pulm: No SOB Interactive and getting along well at home.  Otherwise, ROS is as per the HPI.  Objective:   BP 134/89   Pulse 88   Temp 98.6 F (37 C) (Oral)  Ht 5\' 7"  (1.702 m)   Wt 163 lb 8 oz (74.2 kg)   BMI 25.61 kg/m    Gen: WDWN, NAD; alert,appropriate and cooperative throughout exam    HEENT: Normocephalic and atraumatic. Throat clear, w/o exudate, no LAD, R TM clear, L TM - good landmarks, No fluid present. rhinnorhea.  Left frontal and maxillary sinuses: Tender max Right frontal and maxillary sinuses: Tendermax   Neck: No ant or post LAD  CV: RRR, No M/G/R  Pulm: Breathing comfortably in no resp distress. No crackles, but there are expiratory wheezes throughout virtually all lung fields mildly. Abd: S,NT,ND,+BS Extr: no c/c/e Psych: full affect, pleasant    Laboratory and Imaging Data:  Assessment and Plan:   Acute maxillary sinusitis,  recurrence not specified  Tobacco abuse  Wheezing  High-dose amoxicillin, fluids, rest, and over-the-counter cold medication.  Recommended that she not smoke currently.  She is not having any shortness of breath at all or any kind of difficulty walking or running, and she is an avid physically active person, and she is actually a Physiological scientist.  Follow-up: No Follow-up on file.  New Prescriptions   AMOXICILLIN (AMOXIL) 500 MG CAPSULE    Take 2 capsules (1,000 mg total) by mouth 2 (two) times daily.   Modified Medications   No medications on file   No orders of the defined types were placed in this encounter.   Signed,  Maud Deed. Michal Strzelecki, MD   Patient's Medications  New Prescriptions   AMOXICILLIN (AMOXIL) 500 MG CAPSULE    Take 2 capsules (1,000 mg total) by mouth 2 (two) times daily.  Previous Medications   FERROUS SULFATE 325 (65 FE) MG TABLET    Take 325 mg by mouth daily with breakfast.    OMEPRAZOLE (PRILOSEC) 40 MG CAPSULE    Take 1 capsule (40 mg total) by mouth 2 (two) times daily. On an empty stomach  Modified Medications   No medications on file  Discontinued Medications   AMOXICILLIN (AMOXIL) 500 MG CAPSULE    Take 2 capsules (1,000 mg total) by mouth 2 (two) times daily. With food   CLARITHROMYCIN (BIAXIN) 250 MG TABLET    Take 2 tablets (500 mg total) by mouth 2 (two) times daily. With food   PHENYLEPHRINE (SUDAFED PE) 10 MG TABS TABLET    Take 10 mg by mouth every 4 (four) hours as needed.

## 2016-05-07 ENCOUNTER — Ambulatory Visit
Admission: EM | Admit: 2016-05-07 | Discharge: 2016-05-07 | Disposition: A | Discharge status: Home / Self Care | Payer: 59 | Attending: Family Medicine | Admitting: Family Medicine

## 2016-05-07 DIAGNOSIS — L259 Unspecified contact dermatitis, unspecified cause: Secondary | ICD-10-CM | POA: Diagnosis not present

## 2016-05-07 MED ORDER — PREDNISONE 10 MG PO TABS: ORAL_TABLET | ORAL | 1 refills | Status: DC

## 2016-05-07 MED ORDER — PREDNISONE 10 MG (21) PO TBPK: 10.0000 mg | ORAL_TABLET | Freq: Every day | ORAL | 1 refills | Status: DC

## 2016-05-07 NOTE — ED Provider Notes (Addendum)
[facility]    CSN: [id number] Arrival date & time: 05/07/16  1412     History   Chief Complaint Chief Complaint  Patient presents with  . Rash    HPI Carmen Ross is a 55 y.o. female.   The history is provided by the patient.  Rash  Location:  Full body Quality: blistering, itchiness and weeping   Severity:  Moderate Onset quality:  Sudden Timing:  Constant Progression:  Unchanged Chronicity:  New Context: plant contact   Relieved by:  Nothing Ineffective treatments:  Anti-itch cream and antihistamines Associated symptoms: no abdominal pain, no fatigue, no fever, no myalgias and no sore throat     Past Medical History:  Diagnosis Date  . Chicken pox   . UTI (lower urinary tract infection)     Patient Active Problem List   Diagnosis Date Noted  . Helicobacter pylori gastritis 03/15/2016  . Abdominal pain, epigastric 02/16/2016  . Anemia due to chronic blood loss 02/16/2016  . Hospital discharge follow-up 08/22/2014  . Tobacco abuse counseling 07/05/2013  . Tobacco abuse 07/05/2013  . Routine general medical examination at a health care facility 03/28/2013    Past Surgical History:  Procedure Laterality Date  . CESAREAN SECTION  1988  . HERNIA REPAIR  1968    OB History    No data available       Home Medications    Prior to Admission medications   Medication Sig Start Date End Date Taking? Authorizing Provider  ferrous sulfate 325 (65 FE) MG tablet Take 325 mg by mouth daily with breakfast.  02/12/16  Yes Historical Provider, MD  omeprazole (PRILOSEC) 40 MG capsule Take 1 capsule (40 mg total) by mouth 2 (two) times daily. On an empty stomach 03/15/16  Yes Crecencio Mc, MD  amoxicillin (AMOXIL) 500 MG capsule Take 2 capsules (1,000 mg total) by mouth 2 (two) times daily. 04/27/16 05/18/16  Owens Loffler, MD  predniSONE (DELTASONE) 10 MG tablet Taper 6,6,6,5,5,5,4,4,3,3,2,2,1,1 05/07/16   Juline Patch, MD    Family History Family  History  Problem Relation Age of Onset  . Arthritis Mother   . Hypertension Mother   . Hyperlipidemia Father   . Heart disease Father   . Hypertension Father   . Diabetes Father   . Hypertension Maternal Grandmother   . Arthritis Maternal Grandmother   . Hypertension Maternal Grandfather     Social History Social History  Substance Use Topics  . Smoking status: Current Every Day Smoker    Packs/day: 0.50    Types: Cigarettes    Start date: 03/27/1982  . Smokeless tobacco: Never Used  . Alcohol use Yes     Comment: occasional very rare     Allergies   Review of patient's allergies indicates no known allergies.   Review of Systems Review of Systems  Constitutional: Negative for activity change, appetite change, chills, fatigue and fever.  HENT: Negative for sore throat.   Eyes: Negative for itching.  Respiratory: Negative for cough and chest tightness.   Gastrointestinal: Negative for abdominal pain.  Musculoskeletal: Negative for myalgias.  Skin: Positive for rash.     Physical Exam Triage Vital Signs ED Triage Vitals  Enc Vitals Group     BP 05/07/16 1439 126/68     Pulse Rate 05/07/16 1439 84     Resp 05/07/16 1439 16     Temp 05/07/16 1439 98.3 F (36.8 C)     Temp Source 05/07/16 1439 Oral  SpO2 05/07/16 1439 100 %     Weight 05/07/16 1437 155 lb (70.3 kg)     Height 05/07/16 1437 5\' 7"  (1.702 m)     Head Circumference --      Peak Flow --      Pain Score 05/07/16 1440 8     Pain Loc --      Pain Edu? --      Excl. in [place]? --    No data found.   Updated Vital Signs BP 126/68 (BP Location: Left Arm)   Pulse 84   Temp 98.3 F (36.8 C) (Oral)   Resp 16   Ht 5\' 7"  (1.702 m)   Wt 155 lb (70.3 kg)   SpO2 100%   BMI 24.28 kg/m   Visual Acuity Right Eye Distance:   Left Eye Distance:   Bilateral Distance:    Right Eye Near:   Left Eye Near:    Bilateral Near:     Physical Exam  Constitutional: She appears well-developed and  well-nourished.  HENT:  Head: Normocephalic.  Right Ear: External ear normal.  Left Ear: External ear normal.  Eyes: Pupils are equal, round, and reactive to light.  Cardiovascular: Normal rate and regular rhythm.  Exam reveals no gallop and no friction rub.   Pulmonary/Chest: She has no wheezes. She has no rales.  Abdominal: She exhibits no distension.  Skin: Skin is warm and dry. Rash noted. There is erythema.     UC Treatments / Results  Labs (all labs ordered are listed, but only abnormal results are displayed) Labs Reviewed - No data to display  EKG  EKG Interpretation None       Radiology No results found.  Procedures Procedures (including critical care time)  Medications Ordered in UC Medications - No data to display   Initial Impression / Assessment and Plan / UC Course  I have reviewed the triage vital signs and the nursing notes.  Pertinent labs & imaging results that were available during my care of the patient were reviewed by me and considered in my medical decision making (see chart for details).  Clinical Course    I spent 20 minutes with this patient, More than 50% of that time was spent in face to face education, counseling and care coordination.  Final Clinical Impressions(s) / UC Diagnoses   Final diagnoses:  Contact dermatitis    New Prescriptions New Prescriptions   PREDNISONE (DELTASONE) 10 MG TABLET    Taper 6,6,6,5,5,5,4,4,3,3,2,2,1,1     Juline Patch, MD 05/07/16 Woodhaven Jones, MD 05/07/16 1535

## 2016-05-07 NOTE — ED Triage Notes (Signed)
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 Patient states that rash is itchy, patient has tried zanafil, benadryl and cortizone cream.

## 2016-05-09 ENCOUNTER — Telehealth: Payer: Self-pay | Admitting: Emergency Medicine

## 2016-05-09 NOTE — Telephone Encounter (Signed)
Tried calling patient- no answer.  Message was left for patient to call us back if her symptoms are not improving or worsening.

## 2016-05-10 ENCOUNTER — Telehealth: Payer: Self-pay | Admitting: Emergency Medicine

## 2016-05-10 NOTE — Telephone Encounter (Signed)
Patient returned our call.  Patient states that her rash is improving but she is still having the itching.  Patient was instructed to continue with her Prednisone as directed and that once she has completed her course if her symptoms have not resolved to follow-up here or with her PCP. Patient was instructed to call us back if her rash worsens. Patient verbalized understanding.

## 2016-05-12 ENCOUNTER — Other Ambulatory Visit: Payer: Self-pay | Admitting: Obstetrics and Gynecology

## 2016-05-12 DIAGNOSIS — Z1231 Encounter for screening mammogram for malignant neoplasm of breast: Secondary | ICD-10-CM

## 2016-07-08 HISTORY — PX: OOPHORECTOMY: SHX86

## 2016-07-19 NOTE — H&P (Signed)
Carmen Ross is a 55 y.o. female here for laparoscopic BSO . Pt here for a yearly exam and a follow up for left ovarian cyst . Pt denies pelvic pain  Bloating , early satiety . Last ca125 = 11 (11/2015) No vaginal dryness or burning  U/S today shows left ovarian cyst 3.2x2.2 cm with a cauliflower formed nodule .( this nodule is unchanged in size) No FHX of gyn cancer  Past Medical History:  has a past medical history of Anemia, unspecified and Fibroid.  Past Surgical History:  has a past surgical history that includes Inguinal hernia repair; Cesarean section; deviated septum; and Colonoscopy (01/27/2015). Family History: family history includes Endometriosis in her mother; Fibroids in her maternal grandmother. Social History:  reports that she has been smoking.  She has never used smokeless tobacco. She reports that she does not drink alcohol or use illicit drugs. OB/GYN History:  OB History    Gravida Para Term Preterm AB Living   4 2 2  2 2    SAB TAB Ectopic Multiple Live Births   1 1         Allergies: has No Known Allergies. Medications:  Current Outpatient Prescriptions:  .  predniSONE (DELTASONE) 10 MG tablet, Taper 6,6,6,5,5,5,4,4,3,3,2,2,1,1, Disp: , Rfl:  .  ferrous sulfate 325 (65 FE) MG tablet, Take 1 tablet (325 mg total) by mouth daily with breakfast., Disp: 90 tablet, Rfl: 3  Review of Systems: General:                      No fatigue or weight loss Eyes:                           No vision changes Ears:                            No hearing difficulty Respiratory:                No cough or shortness of breath Pulmonary:                  No asthma or shortness of breath Cardiovascular:           No chest pain, palpitations, dyspnea on exertion Gastrointestinal:          No abdominal bloating, chronic diarrhea, constipations, masses, pain or hematochezia Genitourinary:             No hematuria, dysuria, abnormal vaginal discharge, pelvic pain,  Menometrorrhagia Lymphatic:                   No swollen lymph nodes Musculoskeletal:         No muscle weakness Neurologic:                  No extremity weakness, syncope, seizure disorder Psychiatric:                  No history of depression, delusions or suicidal/homicidal ideation    Exam:      Vitals:   05/11/16 1650  BP: 122/80    Body mass index is 24.69 kg/(m^2).  WDWN white/ female in NAD   Lungs: CTA  CV : RRR without murmur   Breast: exam done in sitting and lying position : No dimpling or retraction, no dominant mass, no spontaneous discharge, no axillary adenopathy Neck:  no thyromegaly Abdomen: soft ,  no mass, normal active bowel sounds,  non-tender, no rebound tenderness Pelvic: tanner stage 5 ,  External genitalia: vulva /labia no lesions Urethra: no prolapse Vagina: normal physiologic d/c Cervix: no lesions, no cervical motion tenderness   Uterus: normal size shape and contour, non-tender Adnexa: no mass,  non-tender   Rectovaginal: no mass   Impression:   The primary encounter diagnosis was Encounter for gynecological examination with abnormal finding. Diagnoses of Complex cyst of left ovary, Breast cancer screening, and Encounter for screening fecal occult blood testing were also pertinent to this visit.  Given the appearance of the nodule I recommend removal , possible LMP tumor of the ovary .   Plan:   After full explanation of the appearance of the left ovary she agrees to undergo L/S BSO . Benefits and risks to surgery: The proposed benefit of the surgery has been discussed with the patient. The possible risks include, but are not limited to: organ injury to the bowel , bladder, ureters, and major blood vessels and nerves. There is a possibility of additional surgeries resulting from these injuries. There is also the risk of blood transfusion and the need to receive blood products during or after the procedure which may rarely lead to HIV  or Hepatitis C infection. There is a risk of developing a deep venous thrombosis or a pulmonary embolism . There is the possibility of wound infection and also anesthetic complications, even the rare possibility of death. The patient understands these risks and wishes to proceed. All questions have been answered and the consent has been signed.

## 2016-07-28 ENCOUNTER — Encounter
Admission: RE | Admit: 2016-07-28 | Discharge: 2016-07-28 | Disposition: A | Discharge status: Home / Self Care | Payer: 59 | Source: Ambulatory Visit | Attending: Obstetrics and Gynecology | Admitting: Obstetrics and Gynecology

## 2016-07-28 DIAGNOSIS — Z01812 Encounter for preprocedural laboratory examination: Secondary | ICD-10-CM | POA: Insufficient documentation

## 2016-07-28 HISTORY — DX: Anemia, unspecified: D64.9

## 2016-07-28 HISTORY — DX: Headache, unspecified: R51.9

## 2016-07-28 HISTORY — DX: Headache: R51

## 2016-07-28 LAB — CBC
HCT: 42.5 % (ref 35.0–47.0)
HEMOGLOBIN: 14.6 g/dL (ref 12.0–16.0)
MCH: 30 pg (ref 26.0–34.0)
MCHC: 34.3 g/dL (ref 32.0–36.0)
MCV: 87.5 fL (ref 80.0–100.0)
Platelets: 219 10*3/uL (ref 150–440)
RBC: 4.86 MIL/uL (ref 3.80–5.20)
RDW: 13.6 % (ref 11.5–14.5)
WBC: 7 10*3/uL (ref 3.6–11.0)

## 2016-07-28 LAB — BASIC METABOLIC PANEL
ANION GAP: 8 (ref 5–15)
BUN: 11 mg/dL (ref 6–20)
CALCIUM: 10 mg/dL (ref 8.9–10.3)
CO2: 30 mmol/L (ref 22–32)
Chloride: 103 mmol/L (ref 101–111)
Creatinine, Ser: 0.84 mg/dL (ref 0.44–1.00)
Glucose, Bld: 58 mg/dL — ABNORMAL LOW (ref 65–99)
Potassium: 3.6 mmol/L (ref 3.5–5.1)
Sodium: 141 mmol/L (ref 135–145)

## 2016-07-28 LAB — TYPE AND SCREEN
ABO/RH(D): A POS
ANTIBODY SCREEN: NEGATIVE

## 2016-07-28 NOTE — Patient Instructions (Signed)
  Your procedure is scheduled [id number] Dec 29 , 2017. Report to Same Day Surgery. To find out your arrival time please call [phone] between 1PM - 3PM on Thursday Dec. 28, 2017.  Remember: Instructions that are not followed completely may result in serious medical risk, up to and including death, or upon the discretion of your surgeon and anesthesiologist your surgery may need to be rescheduled.    _x___ 1. Do not eat food or drink liquids after midnight. No gum chewing or hard candies.     _x___ 2. No Alcohol for 24 hours before or after surgery.   ____ 3. Bring all medications with you on the day of surgery if instructed.    __x__ 4. Notify your doctor if there is any change in your medical condition     (cold, fever, infections).    __x___ 5. No smoking 24 hours prior to surgery.     Do not wear jewelry, make-up, hairpins, clips or nail polish.  Do not wear lotions, powders, or perfumes.   Do not shave 48 hours prior to surgery. Men may shave face and neck.  Do not bring valuables to the hospital.    [facility] is not responsible for any belongings or valuables.               Contacts, dentures or bridgework may not be worn into surgery.  Leave your suitcase in the car. After surgery it may be brought to your room.  For patients admitted to the hospital, discharge time is determined by your treatment team.   Patients discharged the day of surgery will not be allowed to drive home.    Please read over the following fact sheets that you were given:   [facility] Preparing for Surgery  __x__ Take these medicines the morning of surgery with A SIP OF WATER:    1. omeprazole (PRILOSEC) please take at bedtime the night before and the morning surgery.    ____ Fleet Enema (as directed)   __x__ Use CHG Soap as directed on instruction sheet  ____ Use inhalers on the day of surgery and bring to hospital day of surgery  ____ Stop metformin 2 days prior to surgery    ____  Take 1/2 of usual insulin dose the night before surgery and none on the morning of surgery.   ____ Stop Coumadin/Plavix/aspirin on does not apply.  _x___ Stop Anti-inflammatories such as Advil, Aleve, Ibuprofen, Motrin, Naproxen, Naprosyn, Goodies powders or aspirin products. OK to take Tylenol.   _x___ Stop supplements EHT until after surgery.    ____ Bring C-Pap to the hospital.

## 2016-08-05 ENCOUNTER — Ambulatory Visit
Admission: RE | Admit: 2016-08-05 | Discharge: 2016-08-05 | Disposition: A | Discharge status: Home / Self Care | Payer: Commercial Managed Care - HMO | Source: Ambulatory Visit | Attending: Obstetrics and Gynecology | Admitting: Obstetrics and Gynecology

## 2016-08-05 ENCOUNTER — Ambulatory Visit: Payer: Commercial Managed Care - HMO | Admitting: Anesthesiology

## 2016-08-05 ENCOUNTER — Encounter: Payer: Self-pay | Admitting: Anesthesiology

## 2016-08-05 ENCOUNTER — Encounter
Admission: RE | Disposition: A | Discharge status: Home / Self Care | Payer: Self-pay | Source: Ambulatory Visit | Attending: Obstetrics and Gynecology

## 2016-08-05 DIAGNOSIS — Z79899 Other long term (current) drug therapy: Secondary | ICD-10-CM | POA: Insufficient documentation

## 2016-08-05 DIAGNOSIS — Z7952 Long term (current) use of systemic steroids: Secondary | ICD-10-CM | POA: Diagnosis not present

## 2016-08-05 DIAGNOSIS — F17200 Nicotine dependence, unspecified, uncomplicated: Secondary | ICD-10-CM | POA: Diagnosis not present

## 2016-08-05 DIAGNOSIS — N838 Other noninflammatory disorders of ovary, fallopian tube and broad ligament: Secondary | ICD-10-CM | POA: Diagnosis not present

## 2016-08-05 DIAGNOSIS — N83292 Other ovarian cyst, left side: Secondary | ICD-10-CM | POA: Insufficient documentation

## 2016-08-05 DIAGNOSIS — K660 Peritoneal adhesions (postprocedural) (postinfection): Secondary | ICD-10-CM | POA: Diagnosis not present

## 2016-08-05 DIAGNOSIS — D649 Anemia, unspecified: Secondary | ICD-10-CM | POA: Diagnosis not present

## 2016-08-05 HISTORY — PX: LAPAROSCOPIC LYSIS OF ADHESIONS: SHX5905

## 2016-08-05 HISTORY — PX: LAPAROSCOPIC BILATERAL SALPINGO OOPHERECTOMY: SHX5890

## 2016-08-05 LAB — ABO/RH: ABO/RH(D): A POS

## 2016-08-05 SURGERY — SALPINGO-OOPHORECTOMY, BILATERAL, LAPAROSCOPIC
Anesthesia: General | Laterality: Bilateral | Wound class: Clean Contaminated

## 2016-08-05 MED ORDER — ONDANSETRON HCL 4 MG/2ML IJ SOLN
INTRAMUSCULAR | Status: DC | PRN
  Administered 2016-08-05: 4 mg via INTRAVENOUS

## 2016-08-05 MED ORDER — DEXAMETHASONE SODIUM PHOSPHATE 10 MG/ML IJ SOLN
INTRAMUSCULAR | Status: DC | PRN
  Administered 2016-08-05: 10 mg via INTRAVENOUS

## 2016-08-05 MED ORDER — FENTANYL CITRATE (PF) 100 MCG/2ML IJ SOLN
INTRAMUSCULAR | Status: AC
  Filled 2016-08-05: qty 2

## 2016-08-05 MED ORDER — ONDANSETRON HCL 4 MG/2ML IJ SOLN
INTRAMUSCULAR | Status: AC
  Filled 2016-08-05: qty 2

## 2016-08-05 MED ORDER — SUGAMMADEX SODIUM 200 MG/2ML IV SOLN
INTRAVENOUS | Status: AC
  Filled 2016-08-05: qty 2

## 2016-08-05 MED ORDER — LACTATED RINGERS IV SOLN
INTRAVENOUS | Status: DC
  Administered 2016-08-05: 07:00:00 via INTRAVENOUS

## 2016-08-05 MED ORDER — FENTANYL CITRATE (PF) 100 MCG/2ML IJ SOLN
25.0000 ug | INTRAMUSCULAR | Status: DC | PRN
  Administered 2016-08-05 (×2): 25 ug via INTRAVENOUS

## 2016-08-05 MED ORDER — DEXAMETHASONE SODIUM PHOSPHATE 10 MG/ML IJ SOLN
INTRAMUSCULAR | Status: AC
  Filled 2016-08-05: qty 1

## 2016-08-05 MED ORDER — KETOROLAC TROMETHAMINE 30 MG/ML IJ SOLN
INTRAMUSCULAR | Status: DC | PRN
  Administered 2016-08-05: 30 mg via INTRAVENOUS

## 2016-08-05 MED ORDER — LIDOCAINE 2% (20 MG/ML) 5 ML SYRINGE
INTRAMUSCULAR | Status: AC
  Filled 2016-08-05: qty 5

## 2016-08-05 MED ORDER — LACTATED RINGERS IV SOLN: INTRAVENOUS | Status: DC

## 2016-08-05 MED ORDER — KETOROLAC TROMETHAMINE 30 MG/ML IJ SOLN
INTRAMUSCULAR | Status: AC
  Filled 2016-08-05: qty 1

## 2016-08-05 MED ORDER — MIDAZOLAM HCL 2 MG/2ML IJ SOLN
INTRAMUSCULAR | Status: DC | PRN
  Administered 2016-08-05: 2 mg via INTRAVENOUS

## 2016-08-05 MED ORDER — SOD CITRATE-CITRIC ACID 500-334 MG/5ML PO SOLN: 30.0000 mL | ORAL | Status: DC

## 2016-08-05 MED ORDER — FENTANYL CITRATE (PF) 100 MCG/2ML IJ SOLN
INTRAMUSCULAR | Status: DC | PRN
  Administered 2016-08-05: 100 ug via INTRAVENOUS

## 2016-08-05 MED ORDER — BUPIVACAINE HCL 0.5 % IJ SOLN
INTRAMUSCULAR | Status: DC | PRN
  Administered 2016-08-05: 10 mL

## 2016-08-05 MED ORDER — SUGAMMADEX SODIUM 200 MG/2ML IV SOLN
INTRAVENOUS | Status: DC | PRN
  Administered 2016-08-05: 145 mg via INTRAVENOUS

## 2016-08-05 MED ORDER — EPHEDRINE SULFATE 50 MG/ML IJ SOLN
INTRAMUSCULAR | Status: DC | PRN
  Administered 2016-08-05 (×2): 10 mg via INTRAVENOUS

## 2016-08-05 MED ORDER — ONDANSETRON HCL 4 MG/2ML IJ SOLN: 4.0000 mg | Freq: Once | INTRAMUSCULAR | Status: DC | PRN

## 2016-08-05 MED ORDER — PROPOFOL 10 MG/ML IV BOLUS
INTRAVENOUS | Status: DC | PRN
  Administered 2016-08-05: 100 mg via INTRAVENOUS

## 2016-08-05 MED ORDER — BUPIVACAINE HCL (PF) 0.5 % IJ SOLN
INTRAMUSCULAR | Status: AC
  Filled 2016-08-05: qty 30

## 2016-08-05 MED ORDER — PROPOFOL 10 MG/ML IV BOLUS
INTRAVENOUS | Status: AC
  Filled 2016-08-05: qty 20

## 2016-08-05 MED ORDER — MIDAZOLAM HCL 2 MG/2ML IJ SOLN
INTRAMUSCULAR | Status: AC
  Filled 2016-08-05: qty 2

## 2016-08-05 MED ORDER — ROCURONIUM BROMIDE 100 MG/10ML IV SOLN
INTRAVENOUS | Status: DC | PRN
  Administered 2016-08-05: 30 mg via INTRAVENOUS

## 2016-08-05 SURGICAL SUPPLY — 40 items
ANCHOR TIS RET SYS 235ML (MISCELLANEOUS) ×4 IMPLANT
BAG URO DRAIN 2000ML W/SPOUT (MISCELLANEOUS) ×4 IMPLANT
BLADE SURG SZ11 CARB STEEL (BLADE) ×4 IMPLANT
CANISTER SUCT 1200ML W/VALVE (MISCELLANEOUS) ×4 IMPLANT
CATH ROBINSON RED A/P 16FR (CATHETERS) ×4 IMPLANT
CHLORAPREP W/TINT 26ML (MISCELLANEOUS) ×16 IMPLANT
CLOSURE WOUND 1/4X4 (GAUZE/BANDAGES/DRESSINGS) ×1
DEVICE PMI PUNCTURE CLOSURE (MISCELLANEOUS) ×4 IMPLANT
DRSG TEGADERM 2-3/8X2-3/4 SM (GAUZE/BANDAGES/DRESSINGS) ×4 IMPLANT
ENDOPOUCH RETRIEVER 10 (MISCELLANEOUS) ×4 IMPLANT
GAUZE SPONGE NON-WVN 2X2 STRL (MISCELLANEOUS) ×2 IMPLANT
GLOVE BIO SURGEON STRL SZ8 (GLOVE) ×16 IMPLANT
GOWN STRL REUS W/ TWL LRG LVL3 (GOWN DISPOSABLE) ×4 IMPLANT
GOWN STRL REUS W/ TWL XL LVL3 (GOWN DISPOSABLE) ×2 IMPLANT
GOWN STRL REUS W/TWL LRG LVL3 (GOWN DISPOSABLE) ×4
GOWN STRL REUS W/TWL XL LVL3 (GOWN DISPOSABLE) ×2
IRRIGATION STRYKERFLOW (MISCELLANEOUS) ×2 IMPLANT
IRRIGATOR STRYKERFLOW (MISCELLANEOUS) ×4
IV NS 1000ML (IV SOLUTION) ×2
IV NS 1000ML BAXH (IV SOLUTION) ×2 IMPLANT
KIT RM TURNOVER CYSTO AR (KITS) ×4 IMPLANT
LABEL OR SOLS (LABEL) ×4 IMPLANT
NS IRRIG 500ML POUR BTL (IV SOLUTION) ×4 IMPLANT
PACK GYN LAPAROSCOPIC (MISCELLANEOUS) ×4 IMPLANT
PAD OB MATERNITY 4.3X12.25 (PERSONAL CARE ITEMS) ×4 IMPLANT
PAD PREP 24X41 OB/GYN DISP (PERSONAL CARE ITEMS) ×4 IMPLANT
SHEARS HARMONIC ACE PLUS 36CM (ENDOMECHANICALS) ×4 IMPLANT
SPONGE VERSALON 2X2 STRL (MISCELLANEOUS) ×2
STRIP CLOSURE SKIN 1/4X4 (GAUZE/BANDAGES/DRESSINGS) ×3 IMPLANT
SUT VIC AB 0 UR5 27 (SUTURE) ×8 IMPLANT
SUT VIC AB 2-0 UR6 27 (SUTURE) ×4 IMPLANT
SUT VIC AB 3-0 SH 27 (SUTURE) ×2
SUT VIC AB 3-0 SH 27X BRD (SUTURE) ×2 IMPLANT
SUT VIC AB 4-0 SH 27 (SUTURE) ×4
SUT VIC AB 4-0 SH 27XANBCTRL (SUTURE) ×4 IMPLANT
SWABSTK COMLB BENZOIN TINCTURE (MISCELLANEOUS) ×4 IMPLANT
TROCAR ENDO BLADELESS 11MM (ENDOMECHANICALS) ×4 IMPLANT
TROCAR XCEL NON-BLD 5MMX100MML (ENDOMECHANICALS) ×4 IMPLANT
TROCAR XCEL UNIV SLVE 11M 100M (ENDOMECHANICALS) ×4 IMPLANT
TUBING INSUFFLATOR HI FLOW (MISCELLANEOUS) ×4 IMPLANT

## 2016-08-05 NOTE — Discharge Instructions (Signed)

## 2016-08-05 NOTE — Op Note (Deleted)
  The note originally documented on this encounter has been moved the the encounter in which it belongs.  

## 2016-08-05 NOTE — Brief Op Note (Signed)
08/05/2016  8:50 AM  PATIENT:  Carmen Ross  55 y.o. female  PRE-OPERATIVE DIAGNOSIS:  Complex (L) OV Cyst  POST-OPERATIVE DIAGNOSIS:  Complex left ovarian cyst ,  Abdominal adhesions   PROCEDURE:  Procedure(s): LAPAROSCOPIC BILATERAL SALPINGO OOPHORECTOMY (Bilateral)  Lysis of adhesions   SURGEON:  Surgeon(s) and Role:    * Boykin Nearing, MD - Primary    * Benjaman Kindler, MD  PHYSICIAN ASSISTANT:   ASSISTANTS: none   ANESTHESIA: GETA EBL:  Total I/O In: -  Out: 60 [Urine:50; Blood:10]  BLOOD ADMINISTERED:none  DRAINS: none   LOCAL MEDICATIONS USED:  MARCAINE     SPECIMEN:  Source of Specimen:  pelvic washings , right and left tube and ovary  DISPOSITION OF SPECIMEN:  PATHOLOGY  COUNTS:  YES  TOURNIQUET:  * No tourniquets in log *  DICTATION: .Other Dictation: Dictation Number verbal   PLAN OF CARE: Discharge to home after PACU  PATIENT DISPOSITION:  PACU - hemodynamically stable.   Delay start of Pharmacological VTE agent (>24hrs) due to surgical blood loss or risk of bleeding: not applicable

## 2016-08-05 NOTE — Anesthesia Postprocedure Evaluation (Signed)
Anesthesia Post Note  Patient: Carmen Ross  Procedure(s) Performed: Procedure(s) (LRB): LAPAROSCOPIC BILATERAL SALPINGO OOPHORECTOMY (Bilateral) LAPAROSCOPIC LYSIS OF ADHESIONS  Patient location during evaluation: PACU Anesthesia Type: General Level of consciousness: awake and alert Pain management: pain level controlled Vital Signs Assessment: post-procedure vital signs reviewed and stable Respiratory status: spontaneous breathing, nonlabored ventilation, respiratory function stable and patient connected to nasal cannula oxygen Cardiovascular status: blood pressure returned to baseline and stable Postop Assessment: no signs of nausea or vomiting Anesthetic complications: no     Last Vitals:  Vitals:   08/05/16 0954 08/05/16 1000  BP: (!) 146/72 (!) 139/56  Pulse: 81 85  Resp: 16   Temp: (!) 35.9 C     Last Pain:  Vitals:   08/05/16 0954  TempSrc: Temporal  PainSc: [place]

## 2016-08-05 NOTE — Progress Notes (Signed)
Pt states "ready to go home"

## 2016-08-05 NOTE — Progress Notes (Signed)
Dressings x3 dry and intact as well as peri pad dry

## 2016-08-05 NOTE — Transfer of Care (Signed)
Immediate Anesthesia Transfer of Care Note  Patient: Carmen Ross  Procedure(s) Performed: Procedure(s): LAPAROSCOPIC BILATERAL SALPINGO OOPHORECTOMY (Bilateral) LAPAROSCOPIC LYSIS OF ADHESIONS  Patient Location: PACU  Anesthesia Type:General  Level of Consciousness: sedated and responds to stimulation  Airway & Oxygen Therapy: Patient Spontanous Breathing and Patient connected to face mask oxygen  Post-op Assessment: Report given to RN and Post -op Vital signs reviewed and stable  Post vital signs: Reviewed and stable  Last Vitals:  Vitals:   08/05/16 0623 08/05/16 0857  BP: (!) 116/51 (!) 112/59  Pulse: 75 64  Resp: 18 13  Temp: 36.7 C 36.2 C    Last Pain:  Vitals:   08/05/16 0857  TempSrc:   PainSc: Asleep         Complications: No apparent anesthesia complications

## 2016-08-05 NOTE — Progress Notes (Signed)
Pt ready for surgery . LAbs reviewed . All questions answered

## 2016-08-05 NOTE — Anesthesia Procedure Notes (Signed)
Procedure Name: Intubation Date/Time: 08/05/2016 7:41 AM Performed by: Jonna Clark Pre-anesthesia Checklist: Patient identified, Patient being monitored, Timeout performed, Emergency Drugs available and Suction available Patient Re-evaluated:Patient Re-evaluated prior to inductionOxygen Delivery Method: Circle system utilized Preoxygenation: Pre-oxygenation with 100% oxygen Intubation Type: IV induction Ventilation: Mask ventilation without difficulty Laryngoscope Size: Mac and 3 Grade View: Grade I Tube type: Oral Tube size: 7.0 mm Number of attempts: 1 Airway Equipment and Method: Stylet Placement Confirmation: ETT inserted through vocal cords under direct vision,  positive ETCO2 and breath sounds checked- equal and bilateral Secured at: 21 cm Tube secured with: Tape Dental Injury: Teeth and Oropharynx as per pre-operative assessment

## 2016-08-05 NOTE — Anesthesia Preprocedure Evaluation (Signed)
Anesthesia Evaluation  Patient identified by MRN, date of birth, ID band Patient awake    Reviewed: Allergy & Precautions, NPO status , Patient's Chart, lab work & pertinent test results, reviewed documented beta blocker date and time   Airway Mallampati: II  TM Distance: >3 FB     Dental  (+) Chipped   Pulmonary Current Smoker,           Cardiovascular      Neuro/Psych  Headaches,    GI/Hepatic   Endo/Other    Renal/GU      Musculoskeletal   Abdominal   Peds  Hematology  (+) anemia ,   Anesthesia Other Findings   Reproductive/Obstetrics                             Anesthesia Physical Anesthesia Plan  ASA: II  Anesthesia Plan: General   Post-op Pain Management:    Induction: Intravenous  Airway Management Planned: Oral ETT  Additional Equipment:   Intra-op Plan:   Post-operative Plan:   Informed Consent: I have reviewed the patients History and Physical, chart, labs and discussed the procedure including the risks, benefits and alternatives for the proposed anesthesia with the patient or authorized representative who has indicated his/her understanding and acceptance.     Plan Discussed with: CRNA  Anesthesia Plan Comments:         Anesthesia Quick Evaluation

## 2016-08-05 NOTE — Op Note (Signed)
NAME:  CLAUD, SINA                ACCOUNT NO.:  [id number]  MEDICAL RECORD NO.:  [id number]  LOCATION:  PAT                          FACILITY:  [facility]  PHYSICIAN:  Laverta Baltimore, MDDATE OF BIRTH:  [birth year]  DATE OF PROCEDURE: DATE OF DISCHARGE:                              OPERATIVE REPORT   PREOPERATIVE DIAGNOSIS:  Complex left ovarian cyst.  POSTOPERATIVE DIAGNOSIS: 1. Complex left ovarian cyst. 2. Abdominal adhesions.  PROCEDURES: 1. Laparoscopic bilateral salpingo-oophorectomy. 2. Abdominal adhesiolysis.  ANESTHESIA:  General endotracheal anesthesia.  FIRST ASSISTANT:  Leafy Ro.  INDICATIONS:  A 55 year old gravida 4, para 2 patient, been followed in the office with serial ultrasounds, CA-125 levels and symptoms for several months.  The patient has had a complex left ovarian cyst with nodular component.  The patient felt uneasy about continuing to follow this expectantly.  DESCRIPTION OF PROCEDURE:  After adequate general endotracheal anesthesia, the patient was placed in dorsal supine position, legs in the [place] stirrups.  The patient's abdomen, perineum, and vagina were prepped and draped in normal sterile fashion.  A time-out was performed. Straight catheterization of the bladder yielded 50 mL clear urine. Gloves were changed.  A 15 mm infraumbilical incision was made after injecting with 0.5% Marcaine.  The laparoscope was advanced into the abdominal cavity under direct visualization with the Optiview cannula. The patient's abdomen was insufflated with carbon dioxide.  The patient was placed in Trendelenburg, second port site was placed in left lower quadrant, 3 cm medial to the left anterior iliac spine.  Direct visualization was used to place the 10 mm port.  Third port was placed in right lower quadrant, also 3 cm medial to the right anterior iliac spine.  The port was placed under direct visualization.  Of note, there was no free fluid.  There was  evidence of a fibroid uterus, multiple fibroids noted, approximately 10 weeks in size.  Left ovary had a dilated paratubal cyst and some nodularity of the left ovary.  No definitive excrescences noted.  Attention was directed to the left fallopian tube which was grasped with the fimbriated end.  The infundibulopelvic ligament was cauterized and transected, and the left tube and ovary was dissected all the way to the cornua and was placed in the anterior cul-de-sac pending completion of right salpingo- oophorectomy.  Again, the right fallopian tube was grasped and the right infundibulopelvic ligament was cauterized and transected and Harmonic scalp was used to dissect the right tube and ovary free, ultimately the transection of the cornua.  Each fallopian tube and ovary were removed separately and will be sent to pathology separately.  Pelvic washings were performed prior to removal of the fallopian tubes.  Harmonic Scalpel was used to take down several adhesions from the right midportion of the abdomen from the bowel to the sidewall.  There were no complications during the procedure.  Pictures were taken.  The patient's abdomen was suctioned and the left lower port site fascia was closed with 2-0 Vicryl suture.  The infraumbilical incision's fascia was closed with 2-0 Vicryl suture as well.  All incisions were then closed with interrupted 4-0 Vicryl suture.  No complications.  ESTIMATED BLOOD LOSS:  Minimal.  INTRAOPERATIVE FLUID:  750 mL.  URINE OUTPUT:  50 mL.  Patient tolerated the procedure well, was taken to recovery room in good condition.          ______________________________ Laverta Baltimore, MD     TS/MEDQ  D:  08/05/2016  T:  08/05/2016  Job:  [id number]

## 2016-08-15 ENCOUNTER — Other Ambulatory Visit: Payer: Self-pay | Admitting: Pathology

## 2016-08-15 LAB — SURGICAL PATHOLOGY

## 2016-08-15 LAB — CYTOLOGY - NON PAP

## 2016-08-31 ENCOUNTER — Inpatient Hospital Stay: Payer: 59

## 2016-08-31 ENCOUNTER — Inpatient Hospital Stay: Payer: 59 | Attending: Obstetrics and Gynecology | Admitting: Obstetrics and Gynecology

## 2016-08-31 VITALS — BP 157/88 | HR 81 | Temp 97.0°F | Ht 67.5 in | Wt 162.0 lb

## 2016-08-31 DIAGNOSIS — C569 Malignant neoplasm of unspecified ovary: Secondary | ICD-10-CM

## 2016-08-31 DIAGNOSIS — D3911 Neoplasm of uncertain behavior of right ovary: Secondary | ICD-10-CM | POA: Insufficient documentation

## 2016-08-31 DIAGNOSIS — D3910 Neoplasm of uncertain behavior of unspecified ovary: Secondary | ICD-10-CM

## 2016-08-31 DIAGNOSIS — K2970 Gastritis, unspecified, without bleeding: Secondary | ICD-10-CM | POA: Insufficient documentation

## 2016-08-31 DIAGNOSIS — Z79899 Other long term (current) drug therapy: Secondary | ICD-10-CM | POA: Diagnosis not present

## 2016-08-31 DIAGNOSIS — D219 Benign neoplasm of connective and other soft tissue, unspecified: Secondary | ICD-10-CM | POA: Diagnosis not present

## 2016-08-31 DIAGNOSIS — B9681 Helicobacter pylori [H. pylori] as the cause of diseases classified elsewhere: Secondary | ICD-10-CM | POA: Insufficient documentation

## 2016-08-31 DIAGNOSIS — C561 Malignant neoplasm of right ovary: Secondary | ICD-10-CM | POA: Insufficient documentation

## 2016-08-31 DIAGNOSIS — D500 Iron deficiency anemia secondary to blood loss (chronic): Secondary | ICD-10-CM | POA: Insufficient documentation

## 2016-08-31 DIAGNOSIS — Z90722 Acquired absence of ovaries, bilateral: Secondary | ICD-10-CM | POA: Insufficient documentation

## 2016-08-31 DIAGNOSIS — F17210 Nicotine dependence, cigarettes, uncomplicated: Secondary | ICD-10-CM | POA: Insufficient documentation

## 2016-08-31 NOTE — Progress Notes (Signed)
  Oncology Nurse Navigator Documentation Chaperoned pelvic exam. Escorted to the lab for Inhibin levels. Navigator Location: [facility] (08/31/16 0900)   )Navigator Encounter Type: Initial GynOnc (08/31/16 0900)                 Multidisiplinary Clinic Type: GYN (08/31/16 0900)   Patient Visit Type: GynOnc (08/31/16 0900)                    Acuity: Level 1 (08/31/16 0900) Acuity Level 1: Initial guidance, education and coordination as needed (08/31/16 0900)       Time Spent with Patient: 15 (08/31/16 0900)

## 2016-08-31 NOTE — Progress Notes (Signed)
Gynecologic Oncology Consult Visit   Referring Provider: Dr Ouida Sills  Chief Concern:granulosa cell tumor of the ovary  Subjective:  Carmen Ross is a 56 y.o. G4P2 female who is seen in consultation from Dr. Ouida Sills for 4 mm granulosa cell tumor.  She was followed in the office with serial ultrasounds, normal CA-125 levels (11 in 4/17)and symptoms for several months for complex 3.2 cm left ovarian cyst with nodular component.  Office [place] 10/17, unchanged from prior scan, but the patient felt uneasy about continuing to follow this expectantly. 08/05/17 underwent laparoscipic BSO.  Found to have 10 week size fibroids and small lesion in left ovary, removed intact.  Final pathology revealed benign pathology as noted below with incidental finding of small granulosa cell tumor in contralateral right ovary (4 mm). Washings negative.  DIAGNOSIS:  A. OVARY AND FALLOPIAN TUBE, RIGHT; SALPINGO-OOPHORECTOMY:  - GRANULOSA CELL TUMOR, JUVENILE TYPE. 0.4 cm - SEE CANCER STAGING BELOW.  - FALLOPIAN TUBE WITH BENIGN PARATUBAL CYSTS AND ENDOMETRIOSIS.  - OVARIAN SURFACE WITH BENIGN PAPILLARY STROMAL HYPERPLASIA.  - OVARY WITH MULTIPLE SMALL SEROUS CYST ADENOMAS.   B. OVARY AND FALLOPIAN TUBE, LEFT; SALPINGO-OOPHORECTOMY:  - OVARY WITH SEROUS CYSTADENOMA.  - ENDOMETRIOSIS.  - OVARIAN SURFACE WITH BENIGN PAPILLARY STROMAL HYPERPLASIA.  - NEGATIVE FOR MALIGNANCY.   Problem List: Patient Active Problem List   Diagnosis Date Noted  . Granular cell tumor 08/31/2016  . Helicobacter pylori gastritis 03/15/2016  . Abdominal pain, epigastric 02/16/2016  . Anemia due to chronic blood loss 02/16/2016  . Hospital discharge follow-up 08/22/2014  . Tobacco abuse counseling 07/05/2013  . Tobacco abuse 07/05/2013  . Routine general medical examination at a health care facility 03/28/2013    Past Medical History: Past Medical History:  Diagnosis Date  . Anemia    heavy menstral pepriods 2 years ago,  no periods for 14 months.   . Chicken pox   . Headache    sinus related to weather changes  . UTI (lower urinary tract infection)     Past Surgical History: Past Surgical History:  Procedure Laterality Date  . CESAREAN SECTION  1988  . HERNIA REPAIR  1968  . LAPAROSCOPIC BILATERAL SALPINGO OOPHERECTOMY Bilateral 08/05/2016   Procedure: LAPAROSCOPIC BILATERAL SALPINGO OOPHORECTOMY;  Surgeon: Boykin Nearing, MD;  Location: [facility] ORS;  Service: Gynecology;  Laterality: Bilateral;  . LAPAROSCOPIC LYSIS OF ADHESIONS  08/05/2016   Procedure: LAPAROSCOPIC LYSIS OF ADHESIONS;  Surgeon: Boykin Nearing, MD;  Location: [facility] ORS;  Service: Gynecology;;     Family History: Family History  Problem Relation Age of Onset  . Arthritis Mother   . Hypertension Mother   . Hyperlipidemia Father   . Heart disease Father   . Hypertension Father   . Diabetes Father   . Hypertension Maternal Grandmother   . Arthritis Maternal Grandmother   . Hypertension Maternal Grandfather     Social History: Social History   Social History  . Marital status: Married    Spouse name: N/A  . Number of children: N/A  . Years of education: N/A   Occupational History  . Not on file.   Social History Main Topics  . Smoking status: Current Every Day Smoker    Packs/day: 0.50    Types: Cigarettes    Start date: 03/27/1982  . Smokeless tobacco: Never Used  . Alcohol use Yes     Comment: occasional very rare glass of wine with holiday, 5-6 per year  . Drug use: No  .  Sexual activity: Yes   Other Topics Concern  . Not on file   Social History Narrative  . No narrative on file    Allergies: Allergies  Allergen Reactions  . Tape Rash    Current Medications: Current Outpatient Prescriptions  Medication Sig Dispense Refill  . acetaminophen (TYLENOL) 500 MG tablet Take 500 mg by mouth every 6 (six) hours as needed for mild pain.    . ferrous sulfate 325 (65 FE) MG tablet Take 325 mg by  mouth daily with breakfast.   3  . ibuprofen (ADVIL,MOTRIN) 200 MG tablet Take 200 mg by mouth every 6 (six) hours as needed for mild pain.    Marland Kitchen omeprazole (PRILOSEC) 40 MG capsule Take 1 capsule (40 mg total) by mouth 2 (two) times daily. On an empty stomach (Patient taking differently: Take 40 mg by mouth daily as needed (heartburn). On an empty stomach) 30 capsule 0  . OVER THE COUNTER MEDICATION Take 1 tablet by mouth daily. EHT  Memory/Immune Supplement    . Pseudoephedrine HCl (SUDAFED PO) Take 1 tablet by mouth daily.     No current facility-administered medications for this visit.     Review of Systems General: negative for, fevers, chills, fatigue, changes in sleep, changes in weight or appetite Skin: negative for changes in color, texture, moles or lesions Eyes: negative for, changes in vision, pain, diplopia HEENT: negative for, change in hearing, pain, discharge, tinnitus, vertigo, voice changes, sore throat, neck masses Breasts: negative for breast lumps Pulmonary: negative for, dyspnea, orthopnea, productive cough Cardiac: negative for, palpitations, syncope, pain, discomfort, pressure Gastrointestinal: negative for, dysphagia, nausea, vomiting, jaundice, pain, constipation, diarrhea, hematemesis, hematochezia Genitourinary/Sexual: negative for, dysuria, discharge, hesitancy, nocturia, retention, stones, infections, STD's, incontinence Ob/Gyn: negative for, irregular bleeding, pain Musculoskeletal: negative for, pain, stiffness, swelling, range of motion limitation Hematology: negative for, easy bruising, bleeding Neurologic/Psych: negative for, headaches, seizures, paralysis, weakness, tremor, change in gait, change in sensation, mood swings, depression, anxiety, change in memory  Objective:  Physical Examination:  BP (!) 157/88 (BP Location: Left Arm, Patient Position: Sitting) Comment: Patient states her pressure is usually normal she monitors at home.   Pulse 81   Temp  97 F (36.1 C) (Tympanic)   Ht 5' 7.5" (1.715 m)   Wt 162 lb 0.6 oz (73.5 kg)   BMI 25.00 kg/m    ECOG Performance Status: 0 - Asymptomatic  General appearance: alert, cooperative and appears stated age HEENT:PERRLA, neck supple with midline trachea and thyroid without masses Lymph node survey: non-palpable, axillary, inguinal, supraclavicular Cardiovascular: regular rate and rhythm, no murmurs or gallops Respiratory: normal air entry, lungs clear to auscultation and no rales, rhonchi or wheezing Breast exam: not examined. Abdomen: soft, non-tender, without masses or organomegaly, no hernias and well healed incisions Back: inspection of back is normal Extremities: extremities normal, atraumatic, no cyanosis or edema Skin exam - normal coloration and turgor, no rashes, no suspicious skin lesions noted. Neurological exam reveals alert, oriented, normal speech, no focal findings or movement disorder noted.  Pelvic: exam chaperoned by nurse;  Vulva: normal appearing vulva with no masses, tenderness or lesions; Vagina: normal vagina; Adnexa: surgically absent bilateral no masses; Uterus: uterus is upper limit of normal size; Cervix: anteverted; Rectal: normal rectal, no masses      Assessment:  Carmen Ross is a 56 y.o. 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    Plan:   Problem List Items Addressed This Visit      Other   Granular cell tumor   Relevant Orders   Inhibin A   Inhibin B    Other Visit Diagnoses    Granulosa cell tumor of ovary, unspecified laterality ([place])    -  Primary     We discussed options for management.  Risk of recurrence of small granulosa cell tumor is very low and no further therapy is indicated as the ovary was removed unruptured.  We will order baseline inhibin A/B and she can follow up with Dr  Ouida Sills.  Suggested q6 month visits for one year and then annually.  Would not routinely repeat inhibin levels, but would check if any concerning signs or symptoms.    The patient's diagnosis, an outline of the further diagnostic and laboratory studies which will be required, the recommendation, and alternatives were discussed.  All questions were answered to the patient's satisfaction.  Mellody Drown, MD  CC:  Crecencio Mc, MD [address], [place] [id number] [phone]

## 2016-08-31 NOTE — Progress Notes (Signed)
Patient here for referral. She states she is asymptomatic.

## 2016-09-01 LAB — INHIBIN A: INHIBIN-A: 0.8 pg/mL

## 2016-09-01 LAB — INHIBIN B

## 2016-09-13 ENCOUNTER — Encounter: Payer: Self-pay | Admitting: Internal Medicine

## 2016-09-13 DIAGNOSIS — N3001 Acute cystitis with hematuria: Secondary | ICD-10-CM | POA: Diagnosis not present

## 2016-09-13 DIAGNOSIS — R300 Dysuria: Secondary | ICD-10-CM | POA: Diagnosis not present

## 2016-09-14 ENCOUNTER — Other Ambulatory Visit: Payer: Self-pay | Admitting: Internal Medicine

## 2016-09-14 MED ORDER — CIPROFLOXACIN HCL 250 MG PO TABS: 250.0000 mg | ORAL_TABLET | Freq: Two times a day (BID) | ORAL | 0 refills | Status: DC

## 2016-10-26 DIAGNOSIS — H524 Presbyopia: Secondary | ICD-10-CM | POA: Diagnosis not present

## 2016-10-26 DIAGNOSIS — H5213 Myopia, bilateral: Secondary | ICD-10-CM | POA: Diagnosis not present

## 2016-11-17 ENCOUNTER — Ambulatory Visit: Admission: RE | Admit: 2016-11-17 | Payer: 59 | Source: Ambulatory Visit

## 2016-11-22 ENCOUNTER — Ambulatory Visit
Admission: RE | Admit: 2016-11-22 | Discharge: 2016-11-22 | Disposition: A | Discharge status: Home / Self Care | Payer: 59 | Source: Ambulatory Visit | Attending: Obstetrics and Gynecology | Admitting: Obstetrics and Gynecology

## 2016-11-22 DIAGNOSIS — Z1231 Encounter for screening mammogram for malignant neoplasm of breast: Secondary | ICD-10-CM | POA: Diagnosis not present

## 2017-08-12 IMAGING — MG MM DIGITAL SCREENING BILAT W/ CAD
5 series · 5 of 5 positions shown · non-contrast
Comparison: Previous exam(s).

CLINICAL DATA: Screening.

EXAM:
DIGITAL SCREENING BILATERAL MAMMOGRAM WITH CAD

[L CC]
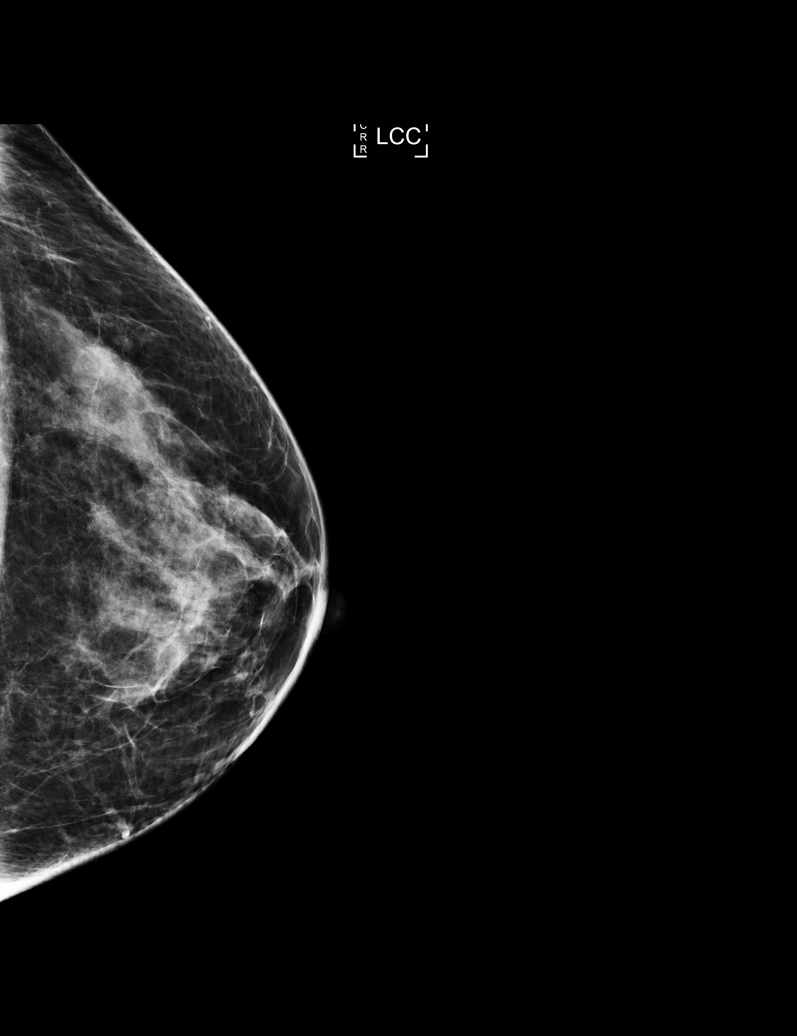

[L MLO]
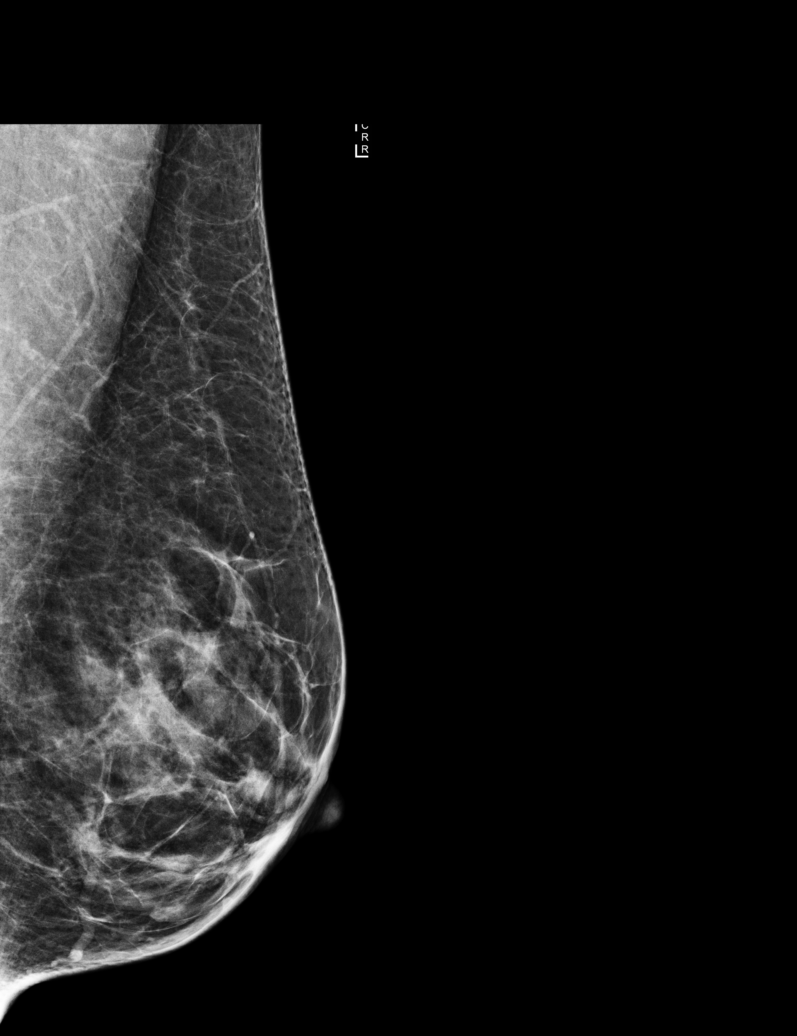

[R MLO (1 of 2)]
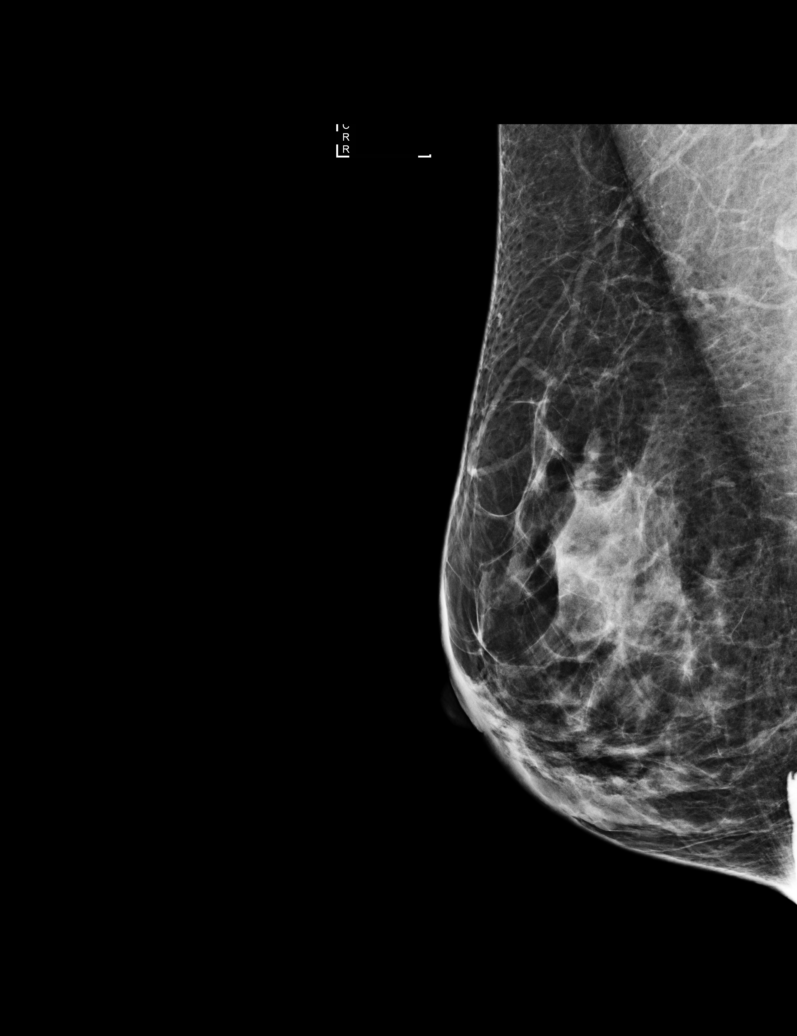

[R MLO (2 of 2)]
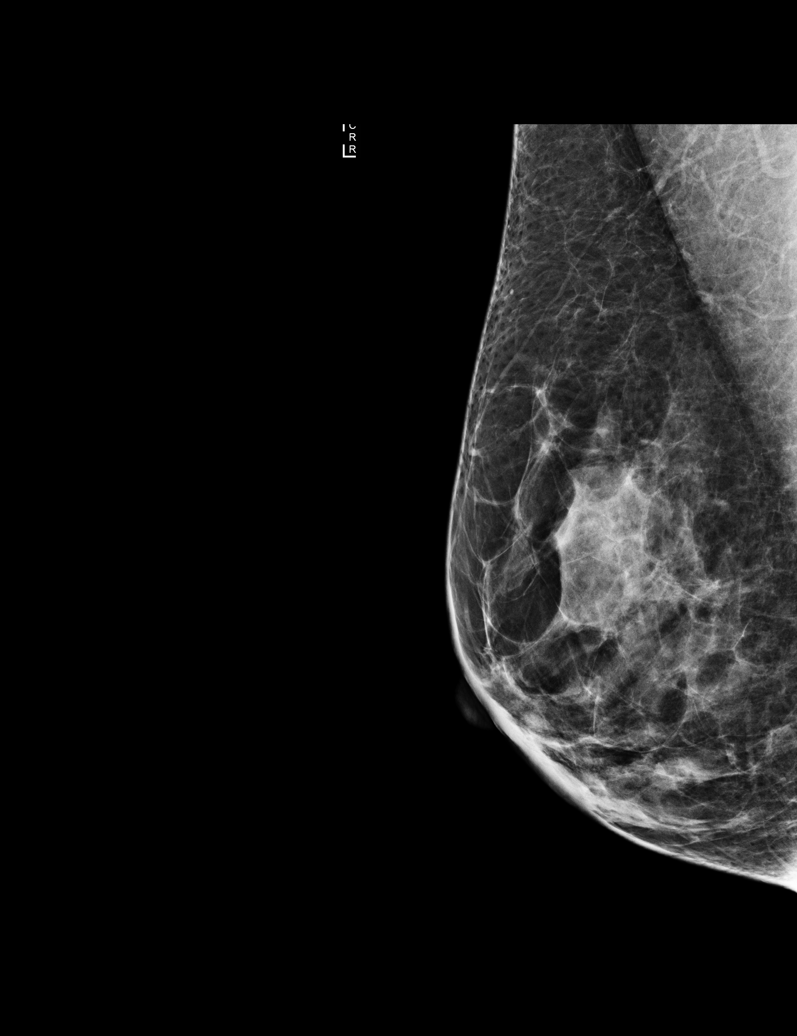

[R CC]
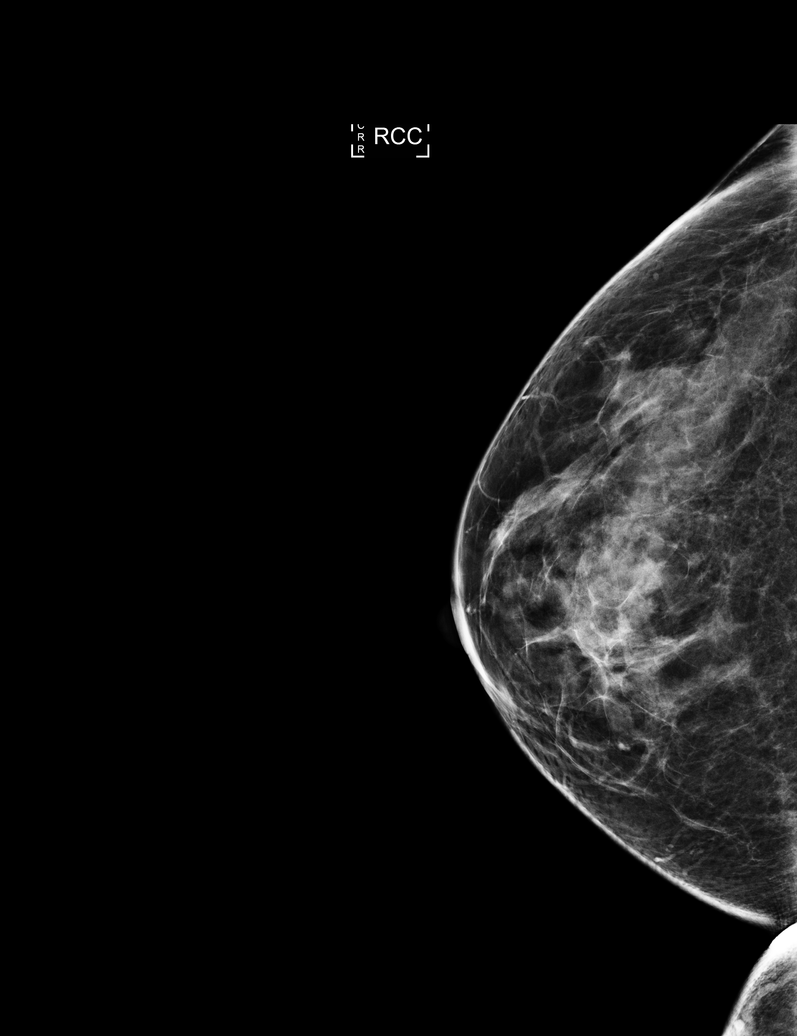

[5 of 5 positions shown; findings below may reference images not displayed]

ACR Breast Density Category c: The breast tissue is heterogeneously
dense, which may obscure small masses.
FINDINGS: There are no findings suspicious for malignancy. Images were
processed with CAD.
IMPRESSION: No mammographic evidence of malignancy. A result letter of this
screening mammogram will be mailed directly to the patient.

RECOMMENDATION:
Screening mammogram in one year. (Code:YJ-[id number])

BI-RADS CATEGORY  1: Negative.

## 2017-11-06 ENCOUNTER — Other Ambulatory Visit: Payer: Self-pay | Admitting: Obstetrics and Gynecology

## 2017-11-06 DIAGNOSIS — Z1231 Encounter for screening mammogram for malignant neoplasm of breast: Secondary | ICD-10-CM

## 2017-11-27 ENCOUNTER — Encounter: Payer: Self-pay | Admitting: Radiology

## 2017-11-27 ENCOUNTER — Ambulatory Visit
Admission: RE | Admit: 2017-11-27 | Discharge: 2017-11-27 | Disposition: A | Discharge status: Home / Self Care | Payer: 59 | Source: Ambulatory Visit | Attending: Obstetrics and Gynecology | Admitting: Obstetrics and Gynecology

## 2017-11-27 DIAGNOSIS — Z1231 Encounter for screening mammogram for malignant neoplasm of breast: Secondary | ICD-10-CM | POA: Diagnosis present

## 2019-01-22 ENCOUNTER — Other Ambulatory Visit: Payer: Self-pay | Admitting: Obstetrics and Gynecology

## 2019-01-22 ENCOUNTER — Other Ambulatory Visit: Payer: Self-pay | Admitting: Internal Medicine

## 2019-02-14 ENCOUNTER — Ambulatory Visit: Payer: 59 | Admitting: Internal Medicine

## 2019-02-20 ENCOUNTER — Other Ambulatory Visit: Payer: Self-pay

## 2019-02-20 ENCOUNTER — Encounter: Payer: Self-pay | Admitting: Emergency Medicine

## 2019-02-20 ENCOUNTER — Emergency Department
Admission: EM | Admit: 2019-02-20 | Discharge: 2019-02-20 | Disposition: A | Discharge status: Home / Self Care | Payer: 59 | Attending: Emergency Medicine | Admitting: Emergency Medicine

## 2019-02-20 ENCOUNTER — Emergency Department: Payer: 59

## 2019-02-20 DIAGNOSIS — M5030 Other cervical disc degeneration, unspecified cervical region: Secondary | ICD-10-CM | POA: Diagnosis not present

## 2019-02-20 DIAGNOSIS — M25552 Pain in left hip: Secondary | ICD-10-CM | POA: Diagnosis not present

## 2019-02-20 DIAGNOSIS — M5412 Radiculopathy, cervical region: Secondary | ICD-10-CM | POA: Diagnosis not present

## 2019-02-20 DIAGNOSIS — F17210 Nicotine dependence, cigarettes, uncomplicated: Secondary | ICD-10-CM | POA: Insufficient documentation

## 2019-02-20 DIAGNOSIS — R200 Anesthesia of skin: Secondary | ICD-10-CM | POA: Diagnosis present

## 2019-02-20 DIAGNOSIS — Z79899 Other long term (current) drug therapy: Secondary | ICD-10-CM | POA: Insufficient documentation

## 2019-02-20 LAB — URINALYSIS, COMPLETE (UACMP) WITH MICROSCOPIC
Bilirubin Urine: NEGATIVE
Glucose, UA: NEGATIVE mg/dL
Ketones, ur: NEGATIVE mg/dL
Leukocytes,Ua: NEGATIVE
Nitrite: NEGATIVE
Protein, ur: NEGATIVE mg/dL
Specific Gravity, Urine: 1.006 (ref 1.005–1.030)
pH: 6 (ref 5.0–8.0)

## 2019-02-20 LAB — CBC WITH DIFFERENTIAL/PLATELET
Abs Immature Granulocytes: 0.03 10*3/uL (ref 0.00–0.07)
Basophils Absolute: 0 10*3/uL (ref 0.0–0.1)
Basophils Relative: 0 %
Eosinophils Absolute: 0.2 10*3/uL (ref 0.0–0.5)
Eosinophils Relative: 3 %
HCT: 45.1 % (ref 36.0–46.0)
Hemoglobin: 15.2 g/dL — ABNORMAL HIGH (ref 12.0–15.0)
Immature Granulocytes: 0 %
Lymphocytes Relative: 19 %
Lymphs Abs: 1.6 10*3/uL (ref 0.7–4.0)
MCH: 29.9 pg (ref 26.0–34.0)
MCHC: 33.7 g/dL (ref 30.0–36.0)
MCV: 88.8 fL (ref 80.0–100.0)
Monocytes Absolute: 0.5 10*3/uL (ref 0.1–1.0)
Monocytes Relative: 6 %
Neutro Abs: 5.8 10*3/uL (ref 1.7–7.7)
Neutrophils Relative %: 72 %
Platelets: 230 10*3/uL (ref 150–400)
RBC: 5.08 MIL/uL (ref 3.87–5.11)
RDW: 13 % (ref 11.5–15.5)
WBC: 8.3 10*3/uL (ref 4.0–10.5)
nRBC: 0 % (ref 0.0–0.2)

## 2019-02-20 LAB — COMPREHENSIVE METABOLIC PANEL
ALT: 13 U/L (ref 0–44)
AST: 20 U/L (ref 15–41)
Albumin: 4.7 g/dL (ref 3.5–5.0)
Alkaline Phosphatase: 69 U/L (ref 38–126)
Anion gap: 10 (ref 5–15)
BUN: 13 mg/dL (ref 6–20)
CO2: 27 mmol/L (ref 22–32)
Calcium: 9.9 mg/dL (ref 8.9–10.3)
Chloride: 103 mmol/L (ref 98–111)
Creatinine, Ser: 0.73 mg/dL (ref 0.44–1.00)
GFR calc Af Amer: >60 mL/min (ref >60)
GFR calc non Af Amer: >60 mL/min (ref >60)
Glucose, Bld: 103 mg/dL — ABNORMAL HIGH (ref 70–99)
Potassium: 3.7 mmol/L (ref 3.5–5.1)
Sodium: 140 mmol/L (ref 135–145)
Total Bilirubin: 1.1 mg/dL (ref 0.3–1.2)
Total Protein: 8.3 g/dL — ABNORMAL HIGH (ref 6.5–8.1)

## 2019-02-20 MED ORDER — PREDNISONE 10 MG PO TABS: ORAL_TABLET | ORAL | 0 refills | Status: DC

## 2019-02-20 NOTE — ED Notes (Signed)
Patient ambulatory with no problems

## 2019-02-20 NOTE — Discharge Instructions (Signed)
Follow-up with your primary care provider if any continued problems.  Begin taking prednisone as directed.  You may also take Tylenol with this medication if added pain medication as needed.  Use ice or heat to your neck as needed for discomfort.

## 2019-02-20 NOTE — ED Triage Notes (Signed)
Patient presents to the ED with transient tingling to her left hip for several months, left arm and right arm tingling for a couple of weeks and left facial tingling since Monday.  Patient also reports lower back pain and discomfort.  Patient is in no obvious distress at this time.

## 2019-02-20 NOTE — ED Provider Notes (Signed)
[facility] Emergency Department Provider Note   ____________________________________________   First MD Initiated Contact with Patient 02/20/19 1113     (approximate)  I have reviewed the triage vital signs and the nursing notes.   HISTORY  Chief Complaint Numbness   HPI Carmen FURNEY is a 58 y.o. female presents to the ED with complaint of tingling in her left hip for several months and this morning more concerning is some paresthesia/tingling in bilateral arms for a couple of weeks but worse today.  She also experienced some similar symptoms in the left side of her face.  Patient states that she has been paining in her home and has been painting walls and ceilings with having to do the ceiling twice.  It was while she was painting that she noticed her arms feeling a numb sensation.  She denies any injury to her arms or neck.  She has not taken any over-the-counter medication.  She denies any recent illness.  She denies any headache.  She has continued to be active this week until today.  She rates her pain as an 8 out of 10.     Past Medical History:  Diagnosis Date   Anemia    heavy menstral pepriods 2 years ago, no periods for 14 months.    Chicken pox    Headache    sinus related to weather changes   UTI (lower urinary tract infection)     Patient Active Problem List   Diagnosis Date Noted   Granular cell tumor 86/57/8469   Helicobacter pylori gastritis 03/15/2016   Abdominal pain, epigastric 02/16/2016   Anemia due to chronic blood loss 02/16/2016   Hospital discharge follow-up 08/22/2014   Tobacco abuse counseling 07/05/2013   Tobacco abuse 07/05/2013   Routine general medical examination at a health care facility 03/28/2013    Past Surgical History:  Procedure Laterality Date   [place] SALPINGO OOPHERECTOMY Bilateral 08/05/2016   Procedure: LAPAROSCOPIC  BILATERAL SALPINGO OOPHORECTOMY;  Surgeon: Boykin Nearing, MD;  Location: [facility] ORS;  Service: Gynecology;  Laterality: Bilateral;   LAPAROSCOPIC LYSIS OF ADHESIONS  08/05/2016   Procedure: LAPAROSCOPIC LYSIS OF ADHESIONS;  Surgeon: Boykin Nearing, MD;  Location: [facility] ORS;  Service: Gynecology;;   OOPHORECTOMY Bilateral 07/2016    Prior to Admission medications   Medication Sig Start Date End Date Taking? Authorizing Provider  acetaminophen (TYLENOL) 500 MG tablet Take 500 mg by mouth every 6 (six) hours as needed for mild pain.    [provider]  ciprofloxacin (CIPRO) 250 MG tablet Take 1 tablet (250 mg total) by mouth 2 (two) times daily. 09/14/16   Crecencio Mc, MD  ferrous sulfate 325 (65 FE) MG tablet Take 325 mg by mouth daily with breakfast.  02/12/16   [provider]  ibuprofen (ADVIL,MOTRIN) 200 MG tablet Take 200 mg by mouth every 6 (six) hours as needed for mild pain.    [provider]  omeprazole (PRILOSEC) 40 MG capsule Take 1 capsule (40 mg total) by mouth 2 (two) times daily. On an empty stomach Patient taking differently: Take 40 mg by mouth daily as needed (heartburn). On an empty stomach 03/15/16   Crecencio Mc, MD  OVER THE COUNTER MEDICATION Take 1 tablet by mouth daily. EHT  Memory/Immune Supplement    [provider]  predniSONE (DELTASONE) 10 MG tablet Take 6 tablets  today, on  day 2 take 5 tablets, day 3 take 4 tablets, day 4 take 3 tablets, day 5 take  2 tablets and 1 tablet the last day 02/20/19   Johnn Hai, PA-C  Pseudoephedrine HCl (SUDAFED PO) Take 1 tablet by mouth daily.    [provider]    Allergies Tape  Family History  Problem Relation Age of Onset   Arthritis Mother    Hypertension Mother    Hyperlipidemia Father    Heart disease Father    Hypertension Father    Diabetes Father    Hypertension Maternal Grandmother    Arthritis Maternal Grandmother    Hypertension  Maternal Grandfather    Breast cancer Neg Hx     Social History Social History   Tobacco Use   Smoking status: Current Every Day Smoker    Packs/day: 0.50    Types: Cigarettes    Start date: 03/27/1982   Smokeless tobacco: Never Used  Substance Use Topics   Alcohol use: Yes    Comment: occasional very rare glass of wine with holiday, 5-6 per year   Drug use: No    Review of Systems Constitutional: No fever/chills Eyes: No visual changes. Cardiovascular: Denies chest pain. Respiratory: Denies shortness of breath. Gastrointestinal: No abdominal pain.  No nausea, no vomiting.  Musculoskeletal: Bilateral arm pain, left hip pain. Skin: Negative for rash. Neurological: Negative for headaches.  Positive for paresthesia sensation bilateral arms. ____________________________________________   PHYSICAL EXAM:  VITAL SIGNS: ED Triage Vitals  Enc Vitals Group     BP 02/20/19 1021 (!) 158/101     Pulse Rate 02/20/19 1021 74     Resp 02/20/19 1021 16     Temp 02/20/19 1021 98.4 F (36.9 C)     Temp Source 02/20/19 1021 Oral     SpO2 02/20/19 1021 100 %     Weight 02/20/19 1022 155 lb (70.3 kg)     Height 02/20/19 1022 5' 7.5" (1.715 m)     Head Circumference --      Peak Flow --      Pain Score 02/20/19 1022 8     Pain Loc --      Pain Edu? --      Excl. in [place]? --    Constitutional: Alert and oriented. Well appearing and in no acute distress. Eyes: Conjunctivae are normal. Head: Atraumatic. Neck: No stridor.  No point tenderness on palpation posteriorly.  Range of motion without restriction. Cardiovascular: Normal rate, regular rhythm. Grossly normal heart sounds.  Good peripheral circulation. Respiratory: Normal respiratory effort.  No retractions. Lungs CTAB. Gastrointestinal: Soft and nontender. No distention. Musculoskeletal: Moves upper and lower extremities that any difficulty.  Normal gait was noted.  Good muscle strength bilaterally.  No gross deformity and no  point tenderness is noted on palpation. Neurologic:  Normal speech and language. No gross focal neurologic deficits are appreciated.  Cranial nerves II through XII grossly intact. Skin:  Skin is warm, dry and intact.  Psychiatric: Mood and affect are normal. Speech and behavior are normal.  ____________________________________________   LABS (all labs ordered are listed, but only abnormal results are displayed)  Labs Reviewed  URINALYSIS, COMPLETE (UACMP) WITH MICROSCOPIC - Abnormal; Notable for the following components:      Result Value   Color, Urine STRAW (*)    APPearance CLEAR (*)    Hgb urine dipstick SMALL (*)    Bacteria, UA RARE (*)    All other components within normal limits  CBC WITH DIFFERENTIAL/PLATELET - Abnormal; Notable for the following components:   Hemoglobin 15.2 (*)    All other components within normal limits  COMPREHENSIVE METABOLIC PANEL - Abnormal; Notable for the following components:   Glucose, Bld 103 (*)    Total Protein 8.3 (*)    All other components within normal limits   RADIOLOGY  Official radiology report(s): Dg Cervical Spine 2-3 Views  Result Date: 02/20/2019 CLINICAL DATA:  Pain numbness left hip and leg. Left facial tingling EXAM: CERVICAL SPINE - 2-3 VIEW COMPARISON:  None. FINDINGS: Normal alignment. Negative for fracture or mass. Mild to moderate disc degeneration and spurring C6-7. Remaining disc spaces normal. Prevertebral soft tissues normal. IMPRESSION: Disc degeneration and spurring C6-7.  No acute bony abnormality. Electronically Signed   By: Franchot Gallo M.D.   On: 02/20/2019 12:53   Dg Hip Unilat W Or Wo Pelvis 2-3 Views Left  Result Date: 02/20/2019 CLINICAL DATA:  Left hip pain and numbness EXAM: DG HIP (WITH OR WITHOUT PELVIS) 2-3V LEFT COMPARISON:  None. FINDINGS: There is no evidence of hip fracture or dislocation. There is no evidence of arthropathy or other focal bone abnormality. IMPRESSION: Negative. Electronically  Signed   By: Franchot Gallo M.D.   On: 02/20/2019 12:54    ____________________________________________   PROCEDURES  Procedure(s) performed (including Critical Care):  Procedures   ____________________________________________   INITIAL IMPRESSION / ASSESSMENT AND PLAN / ED COURSE  As part of my medical decision making, I reviewed the following data within the electronic MEDICAL RECORD NUMBER Notes from prior ED visits and [place] Controlled Substance Database  58 year old female presents to the ED with complaint of bilateral cervical paresthesias/radiculopathy for 1 week but is gotten worse today.  She is also had some "numbness" in her left hip for 1 to 2 months.  She is more concerned about her arms today.  Patient has a history of painting her ceiling twice and also painting walls in her home prior to the worsening of her symptoms.  Lab work was reassuring.  X-ray showed degenerative disc disease C6-C7.  Patient was made aware.  She is reassured and relieved.  Patient was given a prescription for prednisone tapering dose.  At this time she will discontinue painting overhead until her symptoms have been relieved completely.  She was also follow-up with her PCP if any continued problems. ____________________________________________   FINAL CLINICAL IMPRESSION(S) / ED DIAGNOSES  Final diagnoses:  Degenerative disc disease, cervical  Cervical radiculopathy at C6  Hip pain, acute, left     ED Discharge Orders         Ordered    predniSONE (DELTASONE) 10 MG tablet     02/20/19 1311           Note:  This document was prepared using Dragon voice recognition software and may include unintentional dictation errors.    Johnn Hai, PA-C 02/20/19 1526    Earleen Newport, MD 02/21/19 [phone]

## 2019-02-22 ENCOUNTER — Other Ambulatory Visit (INDEPENDENT_AMBULATORY_CARE_PROVIDER_SITE_OTHER): Payer: 59

## 2019-02-22 ENCOUNTER — Other Ambulatory Visit: Payer: Self-pay

## 2019-02-22 ENCOUNTER — Telehealth: Payer: Self-pay

## 2019-02-22 ENCOUNTER — Ambulatory Visit (INDEPENDENT_AMBULATORY_CARE_PROVIDER_SITE_OTHER): Payer: 59 | Admitting: Internal Medicine

## 2019-02-22 ENCOUNTER — Ambulatory Visit: Payer: Self-pay | Admitting: Internal Medicine

## 2019-02-22 ENCOUNTER — Encounter: Payer: Self-pay | Admitting: Internal Medicine

## 2019-02-22 DIAGNOSIS — D219 Benign neoplasm of connective and other soft tissue, unspecified: Secondary | ICD-10-CM

## 2019-02-22 DIAGNOSIS — R300 Dysuria: Secondary | ICD-10-CM | POA: Diagnosis not present

## 2019-02-22 LAB — POCT URINALYSIS DIPSTICK
Bilirubin, UA: NEGATIVE
Glucose, UA: NEGATIVE
Ketones, UA: NEGATIVE
Leukocytes, UA: NEGATIVE
Nitrite, UA: NEGATIVE
Protein, UA: NEGATIVE
Spec Grav, UA: 1.015 (ref 1.010–1.025)
Urobilinogen, UA: 0.2 E.U./dL
pH, UA: 5.5 (ref 5.0–8.0)

## 2019-02-22 MED ORDER — CIPROFLOXACIN HCL 250 MG PO TABS: 250.0000 mg | ORAL_TABLET | Freq: Two times a day (BID) | ORAL | 0 refills | Status: DC

## 2019-02-22 NOTE — Addendum Note (Signed)
Addended by: Elpidio Galea T on: 02/22/2019 02:41 PM   Modules accepted: Orders

## 2019-02-22 NOTE — Telephone Encounter (Signed)
Pt called in c/o perineal discomfort and urinating more frequently than usual.   She has had UTIs in the past and this is how they feel.   She started Prednisone Wed. Night and the symptoms began Thurs. Morning.   Also c/o some mild lower abd discomfort over the bladder area.    I warm transferred her call into Dr. Lupita Dawn office to [place].   I sent my triage notes to the office.   Reason for Disposition . Urinating more frequently than usual (i.e., frequency)  Answer Assessment - Initial Assessment Questions 1. SYMPTOM: "What's the main symptom you're concerned about?" (e.g., frequency, incontinence)     Started Prednisone on Wed. Night.   I'm having discomfort in my perineal area.   2. ONSET: "When did the   Urinary symptoms start?"     Thursday morning  3. PAIN: "Is there any pain?" If so, ask: "How bad is it?" (Scale: 1-10; mild, moderate, severe)     No burning with urination.    4. CAUSE: "What do you think is causing the symptoms?"     Maybe the Prednisone.     5. OTHER SYMPTOMS: "Do you have any other symptoms?" (e.g., fever, flank pain, blood in urine, pain with urination)     A little pain in lower front abd area.  No fever. 6. PREGNANCY: "Is there any chance you are pregnant?" "When was your last menstrual period?"     Not asked due to age.  Protocols used: URINARY [facility]

## 2019-02-22 NOTE — Telephone Encounter (Signed)
Labs ordered for lab appt.  

## 2019-02-22 NOTE — Progress Notes (Signed)
Telephone Note  This visit type was conducted due to national recommendations for restrictions regarding the COVID-19 pandemic (e.g. social distancing).  This format is felt to be most appropriate for this patient at this time.  All issues noted in this document were discussed and addressed.  No physical exam was performed (except for noted visual exam findings with Video Visits).   I connected with@ on 02/22/19 at  3:45 PM EDT by telephone and verified that I am speaking with the correct person using two identifiers. Location patient: home Location provider: work or home office Persons participating in the virtual visit: patient, provider  I discussed the limitations, risks, security and privacy concerns of performing an evaluation and management service by telephone and the availability of in person appointments. I also discussed with the patient that there may be a patient responsible charge related to this service. The patient expressed understanding and agreed to proceed.   Reason for visit: dysuria   HPI:  Dysuria started yesterday morning .  iccurs with every urinary void.  No recent swimming or hot tub use; no recent intercourse.     ROS: See pertinent positives and negatives per HPI.  Past Medical History:  Diagnosis Date  . Anemia    heavy menstral pepriods 2 years ago, no periods for 14 months.   . Chicken pox   . Headache    sinus related to weather changes  . UTI (lower urinary tract infection)     Past Surgical History:  Procedure Laterality Date  . CESAREAN SECTION  1988  . HERNIA REPAIR  1968  . LAPAROSCOPIC BILATERAL SALPINGO OOPHERECTOMY Bilateral 08/05/2016   Procedure: LAPAROSCOPIC BILATERAL SALPINGO OOPHORECTOMY;  Surgeon: Boykin Nearing, MD;  Location: [facility] ORS;  Service: Gynecology;  Laterality: Bilateral;  . LAPAROSCOPIC LYSIS OF ADHESIONS  08/05/2016   Procedure: LAPAROSCOPIC LYSIS OF ADHESIONS;  Surgeon: Boykin Nearing, MD;  Location: [facility]  ORS;  Service: Gynecology;;  . Morene Crocker Bilateral 07/2016    Family History  Problem Relation Age of Onset  . Arthritis Mother   . Hypertension Mother   . Hyperlipidemia Father   . Heart disease Father   . Hypertension Father   . Diabetes Father   . Hypertension Maternal Grandmother   . Arthritis Maternal Grandmother   . Hypertension Maternal Grandfather   . Breast cancer Neg Hx     SOCIAL HX:  reports that she has been smoking cigarettes. She started smoking about 36 years ago. She has been smoking about 0.50 packs per day. She has never used smokeless tobacco. She reports current alcohol use. She reports that she does not use drugs.  Current Outpatient Medications:  .  acetaminophen (TYLENOL) 500 MG tablet, Take 500 mg by mouth every 6 (six) hours as needed for mild pain., Disp: , Rfl:  .  omeprazole (PRILOSEC) 40 MG capsule, Take 1 capsule (40 mg total) by mouth 2 (two) times daily. On an empty stomach (Patient taking differently: Take 40 mg by mouth daily as needed (heartburn). On an empty stomach), Disp: 30 capsule, Rfl: 0 .  Pseudoephedrine HCl (SUDAFED PO), Take 1 tablet by mouth daily., Disp: , Rfl:  .  ciprofloxacin (CIPRO) 250 MG tablet, Take 1 tablet (250 mg total) by mouth 2 (two) times daily., Disp: 6 tablet, Rfl: 0 .  predniSONE (DELTASONE) 10 MG tablet, Take 6 tablets  today, on day 2 take 5 tablets, day 3 take 4 tablets, day 4 take 3 tablets, day 5 take  2  tablets and 1 tablet the last day (Patient not taking: Reported on 02/22/2019), Disp: 21 tablet, Rfl: 0  EXAM:   General impression: alert, cooperative and articulate.  No signs of being in distress  Lungs: speech is fluent sentence length suggests that patient is not short of breath and not punctuated by cough, sneezing or sniffing. Marland Kitchen   Psych: affect normal.  speech is articulate and non pressured .  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  Path was benign   Dysuria No signs of UTI by current urinalysis and culture .  recommending a pelvic exam     I discussed the assessment and treatment plan with the patient. The patient was provided an opportunity to ask questions and all were answered. The patient agreed with the plan and demonstrated an understanding of the instructions.   The patient was advised to call back or seek an in-person evaluation if the symptoms worsen or if the condition fails to improve as anticipated.  I provided  15 minutes of non-face-to-face time during this encounter.   Crecencio Mc, MD

## 2019-02-22 NOTE — Telephone Encounter (Signed)
This is the note from the pt I spoke with you about. Didn't know if you needed it for documentation

## 2019-02-22 NOTE — Telephone Encounter (Signed)
Pt is scheduled for a telephone visit with Dr. Derrel Nip and has already dropped off a urine.

## 2019-02-24 DIAGNOSIS — R300 Dysuria: Secondary | ICD-10-CM | POA: Insufficient documentation

## 2019-02-24 LAB — URINE CULTURE
MICRO NUMBER:: 678764
Result:: NO GROWTH
SPECIMEN QUALITY:: ADEQUATE

## 2019-02-24 LAB — URINALYSIS, MICROSCOPIC ONLY
Bacteria, UA: NONE SEEN /HPF
Hyaline Cast: NONE SEEN /LPF
Squamous Epithelial / LPF: NONE SEEN /HPF (ref <=5)
WBC, UA: NONE SEEN /HPF (ref 0–5)

## 2019-02-24 NOTE — Assessment & Plan Note (Addendum)
after several years of surveillance of ovarian cysts, she underwent BSO  Dec 2018.  Path was benign

## 2019-02-24 NOTE — Assessment & Plan Note (Signed)
No signs of UTI by current urinalysis and culture .  recommending a pelvic exam

## 2019-04-12 ENCOUNTER — Other Ambulatory Visit: Payer: Self-pay

## 2019-04-12 ENCOUNTER — Encounter: Payer: Self-pay | Admitting: Internal Medicine

## 2019-04-12 ENCOUNTER — Ambulatory Visit (INDEPENDENT_AMBULATORY_CARE_PROVIDER_SITE_OTHER): Payer: 59 | Admitting: Internal Medicine

## 2019-04-12 ENCOUNTER — Ambulatory Visit (INDEPENDENT_AMBULATORY_CARE_PROVIDER_SITE_OTHER): Payer: 59

## 2019-04-12 VITALS — BP 130/86 | HR 75 | Temp 97.8°F | Resp 14 | Ht 67.5 in | Wt 161.2 lb

## 2019-04-12 DIAGNOSIS — R5383 Other fatigue: Secondary | ICD-10-CM

## 2019-04-12 DIAGNOSIS — R0689 Other abnormalities of breathing: Secondary | ICD-10-CM | POA: Diagnosis not present

## 2019-04-12 DIAGNOSIS — Z1239 Encounter for other screening for malignant neoplasm of breast: Secondary | ICD-10-CM

## 2019-04-12 DIAGNOSIS — R0989 Other specified symptoms and signs involving the circulatory and respiratory systems: Secondary | ICD-10-CM

## 2019-04-12 DIAGNOSIS — Z113 Encounter for screening for infections with a predominantly sexual mode of transmission: Secondary | ICD-10-CM | POA: Diagnosis not present

## 2019-04-12 DIAGNOSIS — Z720 Tobacco use: Secondary | ICD-10-CM

## 2019-04-12 DIAGNOSIS — D3911 Neoplasm of uncertain behavior of right ovary: Secondary | ICD-10-CM

## 2019-04-12 DIAGNOSIS — Z0000 Encounter for general adult medical examination without abnormal findings: Secondary | ICD-10-CM | POA: Diagnosis not present

## 2019-04-12 DIAGNOSIS — Z13220 Encounter for screening for lipoid disorders: Secondary | ICD-10-CM

## 2019-04-12 DIAGNOSIS — Z124 Encounter for screening for malignant neoplasm of cervix: Secondary | ICD-10-CM

## 2019-04-12 NOTE — Patient Instructions (Signed)

## 2019-04-12 NOTE — Progress Notes (Signed)
Patient ID: Carmen Ross, female    DOB: January 27, 1961  Age: 58 y.o. MRN: [id number]  The patient is here for annual preventive  examination and management of other chronic and acute problems.  Mammogram is due annually PAP smear is due now of vaginal walls  TAH/SO Dec 2017 for suspicious left ovarian cyst, incidental finding of granulosa cel tumor on right ovary  Colonoscopy overdue since 2012    The risk factors are reflected in the social history.  The roster of all physicians providing medical care to patient - is listed in the Snapshot section of the chart.  Activities of daily living:  The patient is 100% independent in all ADLs: dressing, toileting, feeding as well as independent mobility  Home safety : The patient has smoke detectors in the home. They wear seatbelts.  There are no firearms at home. There is no violence in the home.   There is no risks for hepatitis, STDs or HIV. There is no   history of blood transfusion. They have no travel history to infectious disease endemic areas of the world.  The patient has seen their dentist in the last six month. They have seen their eye doctor in the last year. They admit to slight hearing difficulty with regard to whispered voices and some television programs.  They have deferred audiologic testing in the last year.  They do not  have excessive sun exposure. Discussed the need for sun protection: hats, long sleeves and use of sunscreen if there is significant sun exposure.   Diet: the importance of a healthy diet is discussed. They do have a healthy diet.  The benefits of regular aerobic exercise were discussed. She walks 4 times per week ,  20 minutes.   Depression screen: there are no signs or vegative symptoms of depression- irritability, change in appetite, anhedonia, sadness/tearfullness.  Cognitive assessment: the patient manages all their financial and personal affairs and is actively engaged. They could relate day,date,year and  events; recalled 2/3 objects at 3 minutes; performed clock-face test normally.  The following portions of the patient's history were reviewed and updated as appropriate: allergies, current medications, past family history, past medical history,  past surgical history, past social history  and problem list.  Visual acuity was not assessed per patient preference since she has regular follow up with her ophthalmologist. Hearing and body mass index were assessed and reviewed.   During the course of the visit the patient was educated and counseled about appropriate screening and preventive services including : fall prevention , diabetes screening, nutrition counseling, colorectal cancer screening, and recommended immunizations.    CC: The primary encounter diagnosis was Abnormal breath sounds. Diagnoses of Screen for STD (sexually transmitted disease), Screening for hyperlipidemia, Cervical cancer screening, Granulosa cell tumor of ovary, right, Fatigue, unspecified type, Routine general medical examination at a health care facility, and Tobacco abuse were also pertinent to this visit.   1) need for gyn follow up every 6 months per oncology in jan 2018 for granulosa cell tumor of ovary on right (incidental finding) .  History Carmen Ross has a past medical history of Anemia, Chicken pox, Headache, and UTI (lower urinary tract infection).   She has a past surgical history that includes Hernia repair (1968); Cesarean section (1988); Laparoscopic bilateral salpingo oophorectomy (Bilateral, 08/05/2016); Laparoscopic lysis of adhesions (08/05/2016); and Oophorectomy (Bilateral, 07/2016).   Her family history includes Arthritis in her maternal grandmother and mother; Diabetes in her father; Heart disease in her father; Hyperlipidemia  in her father; Hypertension in her father, maternal grandfather, maternal grandmother, and mother.She reports that she has been smoking cigarettes. She started smoking about 37 years  ago. She has been smoking about 0.50 packs per day. She has never used smokeless tobacco. She reports current alcohol use. She reports that she does not use drugs.  Outpatient Medications Prior to Visit  Medication Sig Dispense Refill  . acetaminophen (TYLENOL) 500 MG tablet Take 500 mg by mouth every 6 (six) hours as needed for mild pain.    Marland Kitchen omeprazole (PRILOSEC) 40 MG capsule Take 1 capsule (40 mg total) by mouth 2 (two) times daily. On an empty stomach (Patient taking differently: Take 40 mg by mouth daily as needed (heartburn). On an empty stomach) 30 capsule 0  . ciprofloxacin (CIPRO) 250 MG tablet Take 1 tablet (250 mg total) by mouth 2 (two) times daily. (Patient not taking: Reported on 04/12/2019) 6 tablet 0  . predniSONE (DELTASONE) 10 MG tablet Take 6 tablets  today, on day 2 take 5 tablets, day 3 take 4 tablets, day 4 take 3 tablets, day 5 take  2 tablets and 1 tablet the last day (Patient not taking: Reported on 02/22/2019) 21 tablet 0  . Pseudoephedrine HCl (SUDAFED PO) Take 1 tablet by mouth daily.     No facility-administered medications prior to visit.     Review of Systems   Patient denies headache, fevers, malaise, unintentional weight loss, skin rash, eye pain, sinus congestion and sinus pain, sore throat, dysphagia,  hemoptysis , cough, dyspnea, wheezing, chest pain, palpitations, orthopnea, edema, abdominal pain, nausea, melena, diarrhea, constipation, flank pain, dysuria, hematuria, urinary  Frequency, nocturia, numbness, tingling, seizures,  Focal weakness, Loss of consciousness,  Tremor, insomnia, depression, anxiety, and suicidal ideation.      Objective:  BP 130/86 (BP Location: Left Arm, Patient Position: Sitting, Cuff Size: Normal)   Pulse 75   Temp 97.8 F (36.6 C) (Temporal)   Resp 14   Ht 5' 7.5" (1.715 m)   Wt 161 lb 3.2 oz (73.1 kg)   LMP 05/23/2015   SpO2 96%   BMI 24.87 kg/m   Physical Exam   General Appearance:    Alert, cooperative, no distress,  appears stated age  Head:    Normocephalic, without obvious abnormality, atraumatic  Eyes:    PERRL, conjunctiva/corneas clear, EOM's intact, fundi    benign, both eyes  Ears:    Normal TM's and external ear canals, both ears  Nose:   Nares normal, septum midline, mucosa normal, no drainage    or sinus tenderness  Throat:   Lips, mucosa, and tongue normal; teeth and gums normal  Neck:   Supple, symmetrical, trachea midline, no adenopathy;    thyroid:  no enlargement/tenderness/nodules; no carotid   bruit or JVD  Back:     Symmetric, no curvature, ROM normal, no CVA tenderness  Lungs:     Clear to auscultation bilaterally, respirations unlabored  Chest Wall:    No tenderness or deformity   Heart:    Regular rate and rhythm, S1 and S2 normal, no murmur, rub   or gallop  Breast Exam:    No tenderness, masses, or nipple abnormality  Abdomen:     Soft, non-tender, bowel sounds active all four quadrants,    no masses, no organomegaly  Genitalia:    Pelvic: cervix surgically absent, external genitalia normal, no adnexal masses or tenderness,  rectovaginal septum normal, uterus surgically  absent and vagina normal without discharge  Extremities:   Extremities normal, atraumatic, no cyanosis or edema  Pulses:   2+ and symmetric all extremities  Skin:   Skin color, texture, turgor normal, no rashes or lesions  Lymph nodes:   Cervical, supraclavicular, and axillary nodes normal  Neurologic:   CNII-XII intact, normal strength, sensation and reflexes    throughout      Assessment & Plan:   Problem List Items Addressed This Visit      Unprioritized   Routine general medical examination at a health care facility    age appropriate education and counseling updated, referrals for preventative services and immunizations addressed, dietary and smoking counseling addressed, most recent labs reviewed.  I have personally reviewed and have noted:  1) the patient's medical and social history 2) The  pt's use of alcohol, tobacco, and illicit drugs 3) The patient's current medications and supplements 4) Functional ability including ADL's, fall risk, home safety risk, hearing and visual impairment 5) Diet and physical activities 6) Evidence for depression or mood disorder 7) The patient's height, weight, and BMI have been recorded in the chart  I have made referrals, and provided counseling and education based on review of the above      Tobacco abuse    The patient was counseled on the dangers of tobacco use, and was advised to quit.  Reviewed strategies to maximize success, including removing cigarettes and smoking materials from environment.      Granulosa cell tumor of ovary, right    She has not had 6 month follow up since path report was benign ,  Inhibin A and B ordered,  Pelvic exam normal.  Vaginal PAP done      Relevant Orders   Inhibin A (Completed)   Inhibin B (Completed)    Other Visit Diagnoses    Abnormal breath sounds    -  Primary   Relevant Orders   DG Chest 2 View (Completed)   Screen for STD (sexually transmitted disease)       Screening for hyperlipidemia       Relevant Orders   Lipid panel   Comp Met (CMET)   Cervical cancer screening       Relevant Orders   Pap liquid-based and HPV (high risk)   Fatigue, unspecified type       Relevant Orders   Hemoglobin A1c (Completed)   CBC with Differential/Platelet (Completed)   TSH (Completed)      I have discontinued Valinda Hoar. Uballe's Pseudoephedrine HCl (SUDAFED PO), predniSONE, and ciprofloxacin. I am also having her maintain her omeprazole and acetaminophen.  No orders of the defined types were placed in this encounter.   Medications Discontinued During This Encounter  Medication Reason  . ciprofloxacin (CIPRO) 250 MG tablet Patient has not taken in last 30 days  . predniSONE (DELTASONE) 10 MG tablet Patient has not taken in last 30 days  . Pseudoephedrine HCl (SUDAFED PO) Patient has not taken in  last 30 days    Follow-up: Return in about 1 year (around 04/11/2020).   Crecencio Mc, MD

## 2019-04-15 ENCOUNTER — Encounter: Payer: Self-pay | Admitting: Internal Medicine

## 2019-04-15 ENCOUNTER — Other Ambulatory Visit: Payer: Self-pay | Admitting: Internal Medicine

## 2019-04-15 DIAGNOSIS — Z1211 Encounter for screening for malignant neoplasm of colon: Secondary | ICD-10-CM

## 2019-04-15 NOTE — Assessment & Plan Note (Signed)
The patient was counseled on the dangers of tobacco use, and was advised to quit.  Reviewed strategies to maximize success, including removing cigarettes and smoking materials from environment. 

## 2019-04-15 NOTE — Assessment & Plan Note (Signed)
She has not had 6 month follow up since path report was benign ,  Inhibin A and B ordered,  Pelvic exam normal.  Vaginal PAP done

## 2019-04-15 NOTE — Assessment & Plan Note (Signed)

## 2019-04-16 LAB — CBC WITH DIFFERENTIAL/PLATELET
Basophils Absolute: 0 10*3/uL (ref 0.0–0.2)
Basos: 1 %
EOS (ABSOLUTE): 0.5 10*3/uL — ABNORMAL HIGH (ref 0.0–0.4)
Eos: 6 %
Hematocrit: 42.1 % (ref 34.0–46.6)
Hemoglobin: 14 g/dL (ref 11.1–15.9)
Immature Grans (Abs): 0 10*3/uL (ref 0.0–0.1)
Immature Granulocytes: 0 %
Lymphocytes Absolute: 2.2 10*3/uL (ref 0.7–3.1)
Lymphs: 27 %
MCH: 29.4 pg (ref 26.6–33.0)
MCHC: 33.3 g/dL (ref 31.5–35.7)
MCV: 88 fL (ref 79–97)
Monocytes Absolute: 0.5 10*3/uL (ref 0.1–0.9)
Monocytes: 7 %
Neutrophils Absolute: 4.7 10*3/uL (ref 1.4–7.0)
Neutrophils: 59 %
Platelets: 220 10*3/uL (ref 150–450)
RBC: 4.77 x10E6/uL (ref 3.77–5.28)
RDW: 12.7 % (ref 11.7–15.4)
WBC: 7.9 10*3/uL (ref 3.4–10.8)

## 2019-04-16 LAB — COMPREHENSIVE METABOLIC PANEL
ALT: 10 IU/L (ref 0–32)
AST: 18 IU/L (ref 0–40)
Albumin/Globulin Ratio: 1.8 (ref 1.2–2.2)
Albumin: 4.6 g/dL (ref 3.8–4.9)
Alkaline Phosphatase: 69 IU/L (ref 39–117)
BUN/Creatinine Ratio: 17 (ref 9–23)
BUN: 14 mg/dL (ref 6–24)
Bilirubin Total: 0.3 mg/dL (ref 0.0–1.2)
CO2: 24 mmol/L (ref 20–29)
Calcium: 9.8 mg/dL (ref 8.7–10.2)
Chloride: 104 mmol/L (ref 96–106)
Creatinine, Ser: 0.84 mg/dL (ref 0.57–1.00)
GFR calc Af Amer: 89 mL/min/{1.73_m2} (ref >59)
GFR calc non Af Amer: 77 mL/min/{1.73_m2} (ref >59)
Globulin, Total: 2.5 g/dL (ref 1.5–4.5)
Glucose: 91 mg/dL (ref 65–99)
Potassium: 4.6 mmol/L (ref 3.5–5.2)
Sodium: 143 mmol/L (ref 134–144)
Total Protein: 7.1 g/dL (ref 6.0–8.5)

## 2019-04-16 LAB — HEMOGLOBIN A1C
Est. average glucose Bld gHb Est-mCnc: 105 mg/dL
Hgb A1c MFr Bld: 5.3 % (ref 4.8–5.6)

## 2019-04-16 LAB — LIPID PANEL
Chol/HDL Ratio: 3 ratio (ref 0.0–4.4)
Cholesterol, Total: 192 mg/dL (ref 100–199)
HDL: 63 mg/dL (ref >39)
LDL Chol Calc (NIH): 107 mg/dL — ABNORMAL HIGH (ref 0–99)
Triglycerides: 126 mg/dL (ref 0–149)
VLDL Cholesterol Cal: 22 mg/dL (ref 5–40)

## 2019-04-16 LAB — TSH: TSH: 1.61 u[IU]/mL (ref 0.450–4.500)

## 2019-04-16 LAB — INHIBIN B: Inhibin B: 8 pg/mL (ref 0.0–16.9)

## 2019-04-16 LAB — INHIBIN A: Inhibin A, Ultrasensitive: 4.5 pg/mL

## 2019-04-19 LAB — PAP LB AND HPV HIGH-RISK: HPV, high-risk: NEGATIVE

## 2019-05-09 ENCOUNTER — Encounter: Payer: Self-pay | Admitting: Internal Medicine

## 2019-05-09 ENCOUNTER — Encounter: Payer: Self-pay | Admitting: *Deleted

## 2019-05-09 DIAGNOSIS — D3911 Neoplasm of uncertain behavior of right ovary: Secondary | ICD-10-CM

## 2019-05-13 ENCOUNTER — Telehealth: Payer: Self-pay | Admitting: Gastroenterology

## 2019-05-13 NOTE — Telephone Encounter (Signed)
Pt left vm she received a call about scheduling a colonoscopy but she does not remeber discussing this with her PCP and she has had a colonoscopy about 4 years ago she would like a call to discuss this as she is confused

## 2019-05-16 ENCOUNTER — Other Ambulatory Visit: Payer: Self-pay

## 2019-05-16 ENCOUNTER — Ambulatory Visit
Admission: RE | Admit: 2019-05-16 | Discharge: 2019-05-16 | Disposition: A | Discharge status: Home / Self Care | Payer: 59 | Source: Ambulatory Visit | Attending: Internal Medicine | Admitting: Internal Medicine

## 2019-05-16 DIAGNOSIS — Z1239 Encounter for other screening for malignant neoplasm of breast: Secondary | ICD-10-CM

## 2019-05-16 DIAGNOSIS — Z1231 Encounter for screening mammogram for malignant neoplasm of breast: Secondary | ICD-10-CM | POA: Diagnosis not present

## 2019-05-16 HISTORY — DX: Neoplasm of uncertain behavior of right ovary: D39.11

## 2020-04-15 ENCOUNTER — Encounter: Payer: 59 | Admitting: Internal Medicine

## 2020-05-19 ENCOUNTER — Other Ambulatory Visit: Payer: Self-pay

## 2020-05-22 ENCOUNTER — Other Ambulatory Visit: Payer: Self-pay

## 2020-05-22 ENCOUNTER — Ambulatory Visit (INDEPENDENT_AMBULATORY_CARE_PROVIDER_SITE_OTHER): Payer: Commercial Managed Care - PPO | Admitting: Internal Medicine

## 2020-05-22 ENCOUNTER — Encounter: Payer: Self-pay | Admitting: Internal Medicine

## 2020-05-22 VITALS — BP 154/98 | HR 96 | Temp 98.1°F | Resp 15 | Ht 67.5 in | Wt 159.8 lb

## 2020-05-22 DIAGNOSIS — D3911 Neoplasm of uncertain behavior of right ovary: Secondary | ICD-10-CM

## 2020-05-22 DIAGNOSIS — R030 Elevated blood-pressure reading, without diagnosis of hypertension: Secondary | ICD-10-CM

## 2020-05-22 DIAGNOSIS — Z23 Encounter for immunization: Secondary | ICD-10-CM | POA: Diagnosis not present

## 2020-05-22 DIAGNOSIS — Z1231 Encounter for screening mammogram for malignant neoplasm of breast: Secondary | ICD-10-CM | POA: Diagnosis not present

## 2020-05-22 DIAGNOSIS — Z0000 Encounter for general adult medical examination without abnormal findings: Secondary | ICD-10-CM

## 2020-05-22 DIAGNOSIS — Z716 Tobacco abuse counseling: Secondary | ICD-10-CM

## 2020-05-22 DIAGNOSIS — R5383 Other fatigue: Secondary | ICD-10-CM | POA: Diagnosis not present

## 2020-05-22 NOTE — Progress Notes (Signed)
Patient ID: Carmen Ross, female    DOB: [birth year]  Age: 59 y.o. MRN: [id number]  The patient is here for annual preventive  examination and management of other chronic and acute problems.  This visit occurred during the SARS-CoV-2 public health emergency.  Safety protocols were in place, including screening questions prior to the visit, additional usage of staff PPE, and extensive cleaning of exam room while observing appropriate contact time as indicated for disinfecting solutions.    Patient has received both doses of the available COVID 19 vaccine without complications.  Patient continues to mask when outside of the home except when walking in yard or at safe distances from others .  Patient denies any change in mood or development of unhealthy behaviors resuting from the pandemic's restriction of activities and socialization.     The risk factors are reflected in the social history.  The roster of all physicians providing medical care to patient - is listed in the Snapshot section of the chart.  Activities of daily living:  The patient is 100% independent in all ADLs: dressing, toileting, feeding as well as independent mobility  Home safety : The patient has smoke detectors in the home. They wear seatbelts.  There are no firearms at home. There is no violence in the home.   Last colonsocopy not in chart.  Done at [facility].  10 yr follow up not due per patiet Pap normal 2020 mammog  There is no risks for hepatitis, STDs or HIV. There is no   history of blood transfusion. They have no travel history to infectious disease endemic areas of the world.  The patient has seen their dentist in the last six month. They have seen their eye doctor in the last year. They admit to slight hearing difficulty with regard to whispered voices and some television programs.  They have deferred audiologic testing in the last year.  They do not  have excessive sun exposure. Discussed the need for sun  protection: hats, long sleeves and use of sunscreen if there is significant sun exposure.   Diet: the importance of a healthy diet is discussed. They do have a healthy diet.  The benefits of regular aerobic exercise were discussed. She walks 4 times per week ,  20 minutes.   Depression screen: there are no signs or vegative symptoms of depression- irritability, change in appetite, anhedonia, sadness/tearfullness.  Cognitive assessment: the patient manages all their financial and personal affairs and is actively engaged. They could relate day,date,year and events; recalled 2/3 objects at 3 minutes; performed clock-face test normally.  The following portions of the patient's history were reviewed and updated as appropriate: allergies, current medications, past family history, past medical history,  past surgical history, past social history  and problem list.  Visual acuity was not assessed per patient preference since she has regular follow up with her ophthalmologist. Hearing and body mass index were assessed and reviewed.   During the course of the visit the patient was educated and counseled about appropriate screening and preventive services including : fall prevention , diabetes screening, nutrition counseling, colorectal cancer screening, and recommended immunizations.    CC: The primary encounter diagnosis was Encounter for preventive health examination. Diagnoses of COVID-19 vaccine regimen to maintain immunity completed, Fatigue, unspecified type, Encounter for screening mammogram for malignant neoplasm of breast, Need for diphtheria-tetanus-pertussis (Tdap) vaccine, Granulosa cell tumor of ovary, right, Routine general medical examination at a health care facility, Tobacco abuse counseling, and Elevated blood pressure  reading in office without diagnosis of hypertension were also pertinent to this visit.  History Rodina has a past medical history of Anemia, Chicken pox, Granulosa cell tumor  of ovary, right, Headache, and UTI (lower urinary tract infection).   She has a past surgical history that includes Hernia repair (1968); Cesarean section (1988); Laparoscopic bilateral salpingo oophorectomy (Bilateral, 08/05/2016); Laparoscopic lysis of adhesions (08/05/2016); and Oophorectomy (Bilateral, 07/2016).   Her family history includes Arthritis in her maternal grandmother and mother; Diabetes in her father; Heart disease in her father; Hyperlipidemia in her father; Hypertension in her father, maternal grandfather, maternal grandmother, and mother.She reports that she has been smoking cigarettes. She started smoking about 38 years ago. She has been smoking about 0.50 packs per day. She has never used smokeless tobacco. She reports current alcohol use. She reports that she does not use drugs.  Outpatient Medications Prior to Visit  Medication Sig Dispense Refill  . acetaminophen (TYLENOL) 500 MG tablet Take 500 mg by mouth every 6 (six) hours as needed for mild pain.    . Lactobacillus (PROBIOTIC ACIDOPHILUS PO) Take 1 capsule by mouth daily.    Marland Kitchen omeprazole (PRILOSEC) 40 MG capsule Take 1 capsule (40 mg total) by mouth 2 (two) times daily. On an empty stomach (Patient taking differently: Take 40 mg by mouth daily as needed (heartburn). On an empty stomach) 30 capsule 0   No facility-administered medications prior to visit.    Review of Systems   Patient denies headache, fevers, malaise, unintentional weight loss, skin rash, eye pain, sinus congestion and sinus pain, sore throat, dysphagia,  hemoptysis , cough, dyspnea, wheezing, chest pain, palpitations, orthopnea, edema, abdominal pain, nausea, melena, diarrhea, constipation, flank pain, dysuria, hematuria, urinary  Frequency, nocturia, numbness, tingling, seizures,  Focal weakness, Loss of consciousness,  Tremor, insomnia, depression, anxiety, and suicidal ideation.     Objective:  BP (!) 154/98 (BP Location: Left Arm, Patient  Position: Sitting, Cuff Size: Normal)   Pulse 96   Temp 98.1 F (36.7 C) (Oral)   Resp 15   Ht 5' 7.5" (1.715 m)   Wt 159 lb 12.8 oz (72.5 kg)   LMP 05/23/2015   SpO2 98%   BMI 24.66 kg/m   Physical Exam  General appearance: alert, cooperative and appears stated age Head: Normocephalic, without obvious abnormality, atraumatic Eyes: conjunctivae/corneas clear. PERRL, EOM's intact. Fundi benign. Ears: normal TM's and external ear canals both ears Nose: Nares normal. Septum midline. Mucosa normal. No drainage or sinus tenderness. Throat: lips, mucosa, and tongue normal; teeth and gums normal Neck: no adenopathy, no carotid bruit, no JVD, supple, symmetrical, trachea midline and thyroid not enlarged, symmetric, no tenderness/mass/nodules Lungs: clear to auscultation bilaterally Breasts: normal appearance, no masses or tenderness Heart: regular rate and rhythm, S1, S2 normal, no murmur, click, rub or gallop Abdomen: soft, non-tender; bowel sounds normal; no masses,  no organomegaly Extremities: extremities normal, atraumatic, no cyanosis or edema Pulses: 2+ and symmetric Skin: Skin color, texture, turgor normal. No rashes or lesions Neurologic: Alert and oriented X 3, normal strength and tone. Normal symmetric reflexes. Normal coordination and gait.     Assessment & Plan:   Problem List Items Addressed This Visit      Unprioritized   Granulosa cell tumor of ovary, right    She has had oncology follow up since her surgery and there has been no recurrence       Routine general medical examination at a health care facility    age  appropriate education and counseling updated, referrals for preventative services and immunizations addressed, dietary and smoking counseling addressed, most recent labs reviewed.  I have personally reviewed and have noted:  1) the patient's medical and social history 2) The pt's use of alcohol, tobacco, and illicit drugs 3) The patient's current  medications and supplements 4) Functional ability including ADL's, fall risk, home safety risk, hearing and visual impairment 5) Diet and physical activities 6) Evidence for depression or mood disorder 7) The patient's height, weight, and BMI have been recorded in the chart  I have made referrals, and provided counseling and education based on review of the above      Tobacco abuse counseling    The patient was counseled on the dangers of tobacco use, and was advised to quit.  Reviewed strategies to maximize success, including removing cigarettes and smoking materials from environment.      White coat syndrome without diagnosis of hypertension    She has no history of hypertension but has had several elevated office readings.  She has checked her BP at home and readings are normal Renal function is normal  Lab Results  Component Value Date   CREATININE 0.79 05/22/2020   Lab Results  Component Value Date   NA 141 05/22/2020   K 4.2 05/22/2020   CL 102 05/22/2020   CO2 26 05/22/2020   No results found for: LABMICR, MICROALBUR           Other Visit Diagnoses    Encounter for preventive health examination    -  Primary   Relevant Orders   Lipid panel (Completed)   Comprehensive metabolic panel (Completed)   COVID-19 vaccine regimen to maintain immunity completed       Relevant Orders   SARS-CoV-2 Semi-Quantitative Total Antibody, Spike   Fatigue, unspecified type       Relevant Orders   TSH (Completed)   CBC with Differential/Platelet (Completed)   Iron, TIBC and Ferritin Panel (Completed)   Vitamin B12 (Completed)   VITAMIN D 25 Hydroxy (Vit-D Deficiency, Fractures) (Completed)   Encounter for screening mammogram for malignant neoplasm of breast       Relevant Orders   MM 3D SCREEN BREAST BILATERAL   Need for diphtheria-tetanus-pertussis (Tdap) vaccine       Relevant Orders   Tdap vaccine greater than or equal to 7yo IM (Completed)      I am having Solon Augusta maintain her omeprazole, acetaminophen, and Lactobacillus (PROBIOTIC ACIDOPHILUS PO).  No orders of the defined types were placed in this encounter.   There are no discontinued medications.  Follow-up: No follow-ups on file.   Crecencio Mc, MD

## 2020-05-22 NOTE — Patient Instructions (Signed)
SEND ME A COPY OF YOUR LAST COLONOSCOPY IF YOU CAN  Your annual mammogram has been ordered.  You are encouraged (required) to call to make your appointment at [facility].  GOOD FOR TEN YEARS   Health Maintenance for Postmenopausal Women Menopause is a normal process in which your ability to get pregnant comes to an end. This process happens slowly over many months or years, usually between the ages of 55 and 88. Menopause is complete when you have missed your menstrual periods for 12 months. It is important to talk with your health care provider about some of the most common conditions that affect women after menopause (postmenopausal women). These include heart disease, cancer, and bone loss (osteoporosis). Adopting a healthy lifestyle and getting preventive care can help to promote your health and wellness. The actions you take can also lower your chances of developing some of these common conditions. What should I know about menopause? During menopause, you may get a number of symptoms, such as:  Hot flashes. These can be moderate or severe.  Night sweats.  Decrease in sex drive.  Mood swings.  Headaches.  Tiredness.  Irritability.  Memory problems.  Insomnia. Choosing to treat or not to treat these symptoms is a decision that you make with your health care provider. Do I need hormone replacement therapy?  Hormone replacement therapy is effective in treating symptoms that are caused by menopause, such as hot flashes and night sweats.  Hormone replacement carries certain risks, especially as you become older. If you are thinking about using estrogen or estrogen with progestin, discuss the benefits and risks with your health care provider. What is my risk for heart disease and stroke? The risk of heart disease, heart attack, and stroke increases as you age. One of the causes may be a change in the body's hormones during menopause.  This can affect how your body uses dietary fats, triglycerides, and cholesterol. Heart attack and stroke are medical emergencies. There are many things that you can do to help prevent heart disease and stroke. Watch your blood pressure  High blood pressure causes heart disease and increases the risk of stroke. This is more likely to develop in people who have high blood pressure readings, are of African descent, or are overweight.  Have your blood pressure checked: ? Every 3-5 years if you are 13-67 years of age. ? Every year if you are 60 years old or older. Eat a healthy diet   Eat a diet that includes plenty of vegetables, fruits, low-fat dairy products, and lean protein.  Do not eat a lot of foods that are high in solid fats, added sugars, or sodium. Get regular exercise Get regular exercise. This is one of the most important things you can do for your health. Most adults should:  Try to exercise for at least 150 minutes each week. The exercise should increase your heart rate and make you sweat (moderate-intensity exercise).  Try to do strengthening exercises at least twice each week. Do these in addition to the moderate-intensity exercise.  Spend less time sitting. Even light physical activity can be beneficial. Other tips  Work with your health care provider to achieve or maintain a healthy weight.  Do not use any products that contain nicotine or tobacco, such as cigarettes, e-cigarettes, and chewing tobacco. If you need help quitting, ask your health care provider.  Know your numbers. Ask your health care provider to check  your cholesterol and your blood sugar (glucose). Continue to have your blood tested as directed by your health care provider. Do I need screening for cancer? Depending on your health history and family history, you may need to have cancer screening at different stages of your life. This may include screening for:  Breast cancer.  Cervical cancer.  Lung  cancer.  Colorectal cancer. What is my risk for osteoporosis? After menopause, you may be at increased risk for osteoporosis. Osteoporosis is a condition in which bone destruction happens more quickly than new bone creation. To help prevent osteoporosis or the bone fractures that can happen because of osteoporosis, you may take the following actions:  If you are 9-27 years old, get at least 1,000 mg of calcium and at least 600 mg of vitamin D per day.  If you are older than age 44 but younger than age 28, get at least 1,200 mg of calcium and at least 600 mg of vitamin D per day.  If you are older than age 41, get at least 1,200 mg of calcium and at least 800 mg of vitamin D per day. Smoking and drinking excessive alcohol increase the risk of osteoporosis. Eat foods that are rich in calcium and vitamin D, and do weight-bearing exercises several times each week as directed by your health care provider. How does menopause affect my mental health? Depression may occur at any age, but it is more common as you become older. Common symptoms of depression include:  Low or sad mood.  Changes in sleep patterns.  Changes in appetite or eating patterns.  Feeling an overall lack of motivation or enjoyment of activities that you previously enjoyed.  Frequent crying spells. Talk with your health care provider if you think that you are experiencing depression. General instructions See your health care provider for regular wellness exams and vaccines. This may include:  Scheduling regular health, dental, and eye exams.  Getting and maintaining your vaccines. These include: ? Influenza vaccine. Get this vaccine each year before the flu season begins. ? Pneumonia vaccine. ? Shingles vaccine. ? Tetanus, diphtheria, and pertussis (Tdap) booster vaccine. Your health care provider may also recommend other immunizations. Tell your health care provider if you have ever been abused or do not feel safe at  home. Summary  Menopause is a normal process in which your ability to get pregnant comes to an end.  This condition causes hot flashes, night sweats, decreased interest in sex, mood swings, headaches, or lack of sleep.  Treatment for this condition may include hormone replacement therapy.  Take actions to keep yourself healthy, including exercising regularly, eating a healthy diet, watching your weight, and checking your blood pressure and blood sugar levels.  Get screened for cancer and depression. Make sure that you are up to date with all your vaccines. This information is not intended to replace advice given to you by your health care provider. Make sure you discuss any questions you have with your health care provider. Document Revised: 07/18/2018 Document Reviewed: 07/18/2018 Elsevier Patient Education  2020 [employer].

## 2020-05-23 ENCOUNTER — Encounter: Payer: Self-pay | Admitting: Internal Medicine

## 2020-05-23 DIAGNOSIS — R030 Elevated blood-pressure reading, without diagnosis of hypertension: Secondary | ICD-10-CM | POA: Insufficient documentation

## 2020-05-23 DIAGNOSIS — I10 Essential (primary) hypertension: Secondary | ICD-10-CM | POA: Insufficient documentation

## 2020-05-23 LAB — IRON,TIBC AND FERRITIN PANEL
Ferritin: 122 ng/mL (ref 15–150)
Iron Saturation: 21 % (ref 15–55)
Iron: 68 ug/dL (ref 27–159)
Total Iron Binding Capacity: 325 ug/dL (ref 250–450)
UIBC: 257 ug/dL (ref 131–425)

## 2020-05-23 LAB — COMPREHENSIVE METABOLIC PANEL
ALT: 10 IU/L (ref 0–32)
AST: 17 IU/L (ref 0–40)
Albumin/Globulin Ratio: 1.9 (ref 1.2–2.2)
Albumin: 4.6 g/dL (ref 3.8–4.9)
Alkaline Phosphatase: 82 IU/L (ref 44–121)
BUN/Creatinine Ratio: 13 (ref 9–23)
BUN: 10 mg/dL (ref 6–24)
Bilirubin Total: 0.4 mg/dL (ref 0.0–1.2)
CO2: 26 mmol/L (ref 20–29)
Calcium: 10 mg/dL (ref 8.7–10.2)
Chloride: 102 mmol/L (ref 96–106)
Creatinine, Ser: 0.79 mg/dL (ref 0.57–1.00)
GFR calc Af Amer: 95 mL/min/{1.73_m2} (ref >59)
GFR calc non Af Amer: 82 mL/min/{1.73_m2} (ref >59)
Globulin, Total: 2.4 g/dL (ref 1.5–4.5)
Glucose: 102 mg/dL — ABNORMAL HIGH (ref 65–99)
Potassium: 4.2 mmol/L (ref 3.5–5.2)
Sodium: 141 mmol/L (ref 134–144)
Total Protein: 7 g/dL (ref 6.0–8.5)

## 2020-05-23 LAB — LIPID PANEL
Chol/HDL Ratio: 3.1 ratio (ref 0.0–4.4)
Cholesterol, Total: 216 mg/dL — ABNORMAL HIGH (ref 100–199)
HDL: 69 mg/dL (ref >39)
LDL Chol Calc (NIH): 130 mg/dL — ABNORMAL HIGH (ref 0–99)
Triglycerides: 97 mg/dL (ref 0–149)
VLDL Cholesterol Cal: 17 mg/dL (ref 5–40)

## 2020-05-23 LAB — CBC WITH DIFFERENTIAL/PLATELET
Basophils Absolute: 0 10*3/uL (ref 0.0–0.2)
Basos: 0 %
EOS (ABSOLUTE): 0.2 10*3/uL (ref 0.0–0.4)
Eos: 3 %
Hematocrit: 42.8 % (ref 34.0–46.6)
Hemoglobin: 14.2 g/dL (ref 11.1–15.9)
Immature Grans (Abs): 0 10*3/uL (ref 0.0–0.1)
Immature Granulocytes: 0 %
Lymphocytes Absolute: 1.8 10*3/uL (ref 0.7–3.1)
Lymphs: 21 %
MCH: 28.9 pg (ref 26.6–33.0)
MCHC: 33.2 g/dL (ref 31.5–35.7)
MCV: 87 fL (ref 79–97)
Monocytes Absolute: 0.7 10*3/uL (ref 0.1–0.9)
Monocytes: 8 %
Neutrophils Absolute: 5.8 10*3/uL (ref 1.4–7.0)
Neutrophils: 68 %
Platelets: 255 10*3/uL (ref 150–450)
RBC: 4.91 x10E6/uL (ref 3.77–5.28)
RDW: 12.5 % (ref 11.7–15.4)
WBC: 8.5 10*3/uL (ref 3.4–10.8)

## 2020-05-23 LAB — VITAMIN B12: Vitamin B-12: 280 pg/mL (ref 232–1245)

## 2020-05-23 LAB — TSH: TSH: 1.41 u[IU]/mL (ref 0.450–4.500)

## 2020-05-23 LAB — VITAMIN D 25 HYDROXY (VIT D DEFICIENCY, FRACTURES): Vit D, 25-Hydroxy: 34.8 ng/mL (ref 30.0–100.0)

## 2020-05-23 LAB — SARS-COV-2 SEMI-QUANTITATIVE TOTAL ANTIBODY, SPIKE
SARS-CoV-2 Semi-Quant Total Ab: 978.5 U/mL (ref <0.8)
SARS-CoV-2 Spike Ab Interp: POSITIVE

## 2020-05-23 NOTE — Assessment & Plan Note (Signed)

## 2020-05-23 NOTE — Progress Notes (Signed)
Your CBC, thyroid , B12, Vitamin D , cholesterol,  liver and kidney function are normal.    I neglected to mention that your blood pressure was elevated again, so I would like you to obtain a few readings at hoe or at work and send them to me.

## 2020-05-23 NOTE — Assessment & Plan Note (Signed)
The patient was counseled on the dangers of tobacco use, and was advised to quit.  Reviewed strategies to maximize success, including removing cigarettes and smoking materials from environment. 

## 2020-05-23 NOTE — Assessment & Plan Note (Signed)
She has had oncology follow up since her surgery and there has been no recurrence

## 2020-05-23 NOTE — Assessment & Plan Note (Signed)
She has no history of hypertension but has had several elevated office readings.  She has checked her BP at home and readings are normal Renal function is normal  Lab Results  Component Value Date   CREATININE 0.79 05/22/2020   Lab Results  Component Value Date   NA 141 05/22/2020   K 4.2 05/22/2020   CL 102 05/22/2020   CO2 26 05/22/2020   No results found for: LABMICR, MICROALBUR

## 2020-06-03 ENCOUNTER — Other Ambulatory Visit: Payer: Self-pay | Admitting: Internal Medicine

## 2020-06-03 MED ORDER — METOPROLOL SUCCINATE ER 25 MG PO TB24: 25.0000 mg | ORAL_TABLET | Freq: Every day | ORAL | 0 refills | Status: DC

## 2020-06-09 ENCOUNTER — Encounter: Payer: Self-pay | Admitting: Internal Medicine

## 2020-06-09 ENCOUNTER — Other Ambulatory Visit: Payer: Self-pay

## 2020-06-09 ENCOUNTER — Telehealth: Payer: Commercial Managed Care - PPO | Admitting: Internal Medicine

## 2020-06-09 DIAGNOSIS — I10 Essential (primary) hypertension: Secondary | ICD-10-CM | POA: Diagnosis not present

## 2020-06-09 DIAGNOSIS — F411 Generalized anxiety disorder: Secondary | ICD-10-CM

## 2020-06-09 DIAGNOSIS — F410 Panic disorder [episodic paroxysmal anxiety] without agoraphobia: Secondary | ICD-10-CM | POA: Diagnosis not present

## 2020-06-09 MED ORDER — AMLODIPINE BESYLATE 2.5 MG PO TABS: 2.5000 mg | ORAL_TABLET | Freq: Every day | ORAL | 3 refills | Status: DC

## 2020-06-09 MED ORDER — ALPRAZOLAM 0.25 MG PO TABS: 0.2500 mg | ORAL_TABLET | Freq: Every day | ORAL | 0 refills | Status: DC

## 2020-06-09 NOTE — Progress Notes (Signed)
Virtual Visit via [place]   This visit type was conducted due to national recommendations for restrictions regarding the COVID-19 pandemic (e.g. social distancing).  This format is felt to be most appropriate for this patient at this time.  All issues noted in this document were discussed and addressed.  No physical exam was performed (except for noted visual exam findings with Video Visits).   I connected with@ on 06/09/20 at  4:30 PM EDT by  video and verified that I am speaking with the correct person using two identifiers. Location patient: home Location provider: work or home office Persons participating in the virtual visit: patient, provider  I discussed the limitations, risks, security and privacy concerns of performing an evaluation and management service by telephone and the availability of in person appointments. I also discussed with the patient that there may be a patient responsible charge related to this service. The patient expressed understanding and agreed to proceed.   Reason for visit: increased anxiety about health   HPI:  59 yr old female presents with several weeks of uncontrolled anxiety,  Accompanied by elevated blood pressure noted during and after recurrent panic attacks along with Left arm tingling, and  Flushing .  Started on Oct 19th.  Has had hot flashes occurring in the past  about once a week, these feel similar but are occurring more frequently  since starting metoprolol for hypertension, which is helping her sleep at night.  She has been thinking introspectively about her emotionality, the way she has managed her anxiety and conflict in the past and is currently very frustrated at her inability to control her symptoms with thought control and meditation   Started metoprolol on Thursday Oct 28. Also got COVID booster same day.  Low grade fevers, chills the following day,  then resolved.   ROS: See pertinent positives and negatives per HPI.  Past Medical  History:  Diagnosis Date  . Anemia    heavy menstral pepriods 2 years ago, no periods for 14 months.   . Chicken pox   . Granulosa cell tumor of ovary, right   . Headache    sinus related to weather changes  . UTI (lower urinary tract infection)     Past Surgical History:  Procedure Laterality Date  . CESAREAN SECTION  1988  . HERNIA REPAIR  1968  . LAPAROSCOPIC BILATERAL SALPINGO OOPHERECTOMY Bilateral 08/05/2016   Procedure: LAPAROSCOPIC BILATERAL SALPINGO OOPHORECTOMY;  Surgeon: Boykin Nearing, MD;  Location: [facility] ORS;  Service: Gynecology;  Laterality: Bilateral;  . LAPAROSCOPIC LYSIS OF ADHESIONS  08/05/2016   Procedure: LAPAROSCOPIC LYSIS OF ADHESIONS;  Surgeon: Boykin Nearing, MD;  Location: [facility] ORS;  Service: Gynecology;;  . Morene Crocker Bilateral 07/2016    Family History  Problem Relation Age of Onset  . Arthritis Mother   . Hypertension Mother   . Hyperlipidemia Father   . Heart disease Father   . Hypertension Father   . Diabetes Father   . Hypertension Maternal Grandmother   . Arthritis Maternal Grandmother   . Hypertension Maternal Grandfather   . Breast cancer Neg Hx     SOCIAL HX:  reports that she has been smoking cigarettes. She started smoking about 38 years ago. She has been smoking about 0.50 packs per day. She has never used smokeless tobacco. She reports current alcohol use. She reports that she does not use drugs.   Current Outpatient Medications:  .  acetaminophen (TYLENOL) 500 MG tablet, Take 500 mg by mouth every  6 (six) hours as needed for mild pain., Disp: , Rfl:  .  Lactobacillus (PROBIOTIC ACIDOPHILUS PO), Take 1 capsule by mouth daily., Disp: , Rfl:  .  omeprazole (PRILOSEC) 40 MG capsule, Take 1 capsule (40 mg total) by mouth 2 (two) times daily. On an empty stomach (Patient taking differently: Take 40 mg by mouth daily as needed (heartburn). On an empty stomach), Disp: 30 capsule, Rfl: 0 .  ALPRAZolam (XANAX) 0.25 MG tablet,  Take 1 tablet (0.25 mg total) by mouth daily. As needed for panic attack, Disp: 20 tablet, Rfl: 0 .  amLODipine (NORVASC) 2.5 MG tablet, Take 1 tablet (2.5 mg total) by mouth daily., Disp: 90 tablet, Rfl: 3  EXAM:  VITALS per patient if applicable:  GENERAL: alert, oriented, appears well and in no acute distress  HEENT: atraumatic, conjunttiva clear, no obvious abnormalities on inspection of external nose and ears  NECK: normal movements of the head and neck  LUNGS: on inspection no signs of respiratory distress, breathing rate appears normal, no obvious gross SOB, gasping or wheezing  CV: no obvious cyanosis  MS: moves all visible extremities without noticeable abnormality  PSYCH/NEURO: pleasant and cooperative, no obvious depression or anxiety, speech and thought processing grossly intact  ASSESSMENT AND PLAN:  Discussed the following assessment and plan:  Essential hypertension  Generalized anxiety disorder with panic attacks  Essential hypertension Tolerating metoprolol less than optimally, thinks it is aggravating her hot flushes. changing to amlodipine 2.5 mg daily   Generalized anxiety disorder with panic attacks She is reporting "tornados" of emotional duress leading to tingling of arm.  Trial of xanax prn episodes.  .  If helpful, Given her concurrent menopausal symptoms,  Will start Effexor at 37.5 mg daily and increase to 75 mg daily after 1 week .  The risks and benefits of benzodiazepine use were discussed with patient today including excessive sedation leading to respiratory depression,  impaired thinking/driving, and addiction.  Patient was advised to avoid concurrent use with alcohol, to use medication only as needed and not to share with others  .   I provided  30 minutes of   face-to-face time during this encounter reviewing patient's current problems and past surgeries, labs and imaging studies, providing counseling on the above mentioned problems , and  coordination  of care .   I discussed the assessment and treatment plan with the patient. The patient was provided an opportunity to ask questions and all were answered. The patient agreed with the plan and demonstrated an understanding of the instructions.   The patient was advised to call back or seek an in-person evaluation if the symptoms worsen or if the condition fails to improve as anticipated.  Crecencio Mc, MD

## 2020-06-10 DIAGNOSIS — F411 Generalized anxiety disorder: Secondary | ICD-10-CM | POA: Insufficient documentation

## 2020-06-10 DIAGNOSIS — F410 Panic disorder [episodic paroxysmal anxiety] without agoraphobia: Secondary | ICD-10-CM | POA: Insufficient documentation

## 2020-06-10 NOTE — Assessment & Plan Note (Addendum)
She is reporting "tornados" of emotional duress leading to tingling of arm.  Trial of xanax prn episodes.  .  If helpful, Given her concurrent menopausal symptoms,  Will start Effexor at 37.5 mg daily and increase to 75 mg daily after 1 week .  The risks and benefits of benzodiazepine use were discussed with patient today including excessive sedation leading to respiratory depression,  impaired thinking/driving, and addiction.  Patient was advised to avoid concurrent use with alcohol, to use medication only as needed and not to share with others  .

## 2020-06-10 NOTE — Assessment & Plan Note (Signed)
Tolerating metoprolol less than optimally, thinks it is aggravating her hot flushes. changing to amlodipine 2.5 mg daily

## 2020-06-16 ENCOUNTER — Telehealth: Payer: Commercial Managed Care - PPO | Admitting: Internal Medicine

## 2020-06-16 ENCOUNTER — Other Ambulatory Visit: Payer: Self-pay

## 2020-06-16 ENCOUNTER — Encounter: Payer: Self-pay | Admitting: Internal Medicine

## 2020-06-16 DIAGNOSIS — F410 Panic disorder [episodic paroxysmal anxiety] without agoraphobia: Secondary | ICD-10-CM

## 2020-06-16 DIAGNOSIS — F411 Generalized anxiety disorder: Secondary | ICD-10-CM

## 2020-06-16 DIAGNOSIS — I10 Essential (primary) hypertension: Secondary | ICD-10-CM

## 2020-06-16 NOTE — Assessment & Plan Note (Signed)
Improved with rare prn use of alprazolam and control of blood pressure.  She has deferred SSRI therapy

## 2020-06-16 NOTE — Assessment & Plan Note (Signed)
Excellent control with 2.5 mg amlodipine based on home readings.  No  change

## 2020-06-16 NOTE — Progress Notes (Signed)
Virtual Visit via [facility]  This visit type was conducted due to national recommendations for restrictions regarding the COVID-19 pandemic (e.g. social distancing).  This format is felt to be most appropriate for this patient at this time.  All issues noted in this document were discussed and addressed.  No physical exam was performed (except for noted visual exam findings with Video Visits).   I connected with@ on 06/16/20 at  4:30 PM EST by a video enabled telemedicine application  and verified that I am speaking with the correct person using two identifiers. Location patient: home Location provider: work or home office Persons participating in the virtual visit: patient, provider  I discussed the limitations, risks, security and privacy concerns of performing an evaluation and management service by telephone and the availability of in person appointments. I also discussed with the patient that there may be a patient responsible charge related to this service. The patient expressed understanding and agreed to proceed.   Reason for visit: follow up on hypertension and panic/anxiety  HPI:  Did not tolerate metoprolol.  Switched to amlodipine. Started it on Nov 4.  Readings have been < 120/80 consistently since then with no side effects  GAD: Prn alprazolam given.   Has taken it once when she felt herself getting anxious/irritated. ,  All previously reported symptoms (flushing,  Right arm tingling,  Chest pain) did not occur   She feels markedly better since the trial of alprazolam.  No longer worried about heart ; understands that her symptoms were triggered by anxiety   ROS: See pertinent positives and negatives per HPI.  Past Medical History:  Diagnosis Date  . Anemia    heavy menstral pepriods 2 years ago, no periods for 14 months.   . Chicken pox   . Granulosa cell tumor of ovary, right   . Headache    sinus related to weather changes  . UTI (lower urinary tract infection)      Past Surgical History:  Procedure Laterality Date  . CESAREAN SECTION  1988  . HERNIA REPAIR  1968  . LAPAROSCOPIC BILATERAL SALPINGO OOPHERECTOMY Bilateral 08/05/2016   Procedure: LAPAROSCOPIC BILATERAL SALPINGO OOPHORECTOMY;  Surgeon: Boykin Nearing, MD;  Location: [facility] ORS;  Service: Gynecology;  Laterality: Bilateral;  . LAPAROSCOPIC LYSIS OF ADHESIONS  08/05/2016   Procedure: LAPAROSCOPIC LYSIS OF ADHESIONS;  Surgeon: Boykin Nearing, MD;  Location: [facility] ORS;  Service: Gynecology;;  . Morene Crocker Bilateral 07/2016    Family History  Problem Relation Age of Onset  . Arthritis Mother   . Hypertension Mother   . Hyperlipidemia Father   . Heart disease Father   . Hypertension Father   . Diabetes Father   . Hypertension Maternal Grandmother   . Arthritis Maternal Grandmother   . Hypertension Maternal Grandfather   . Breast cancer Neg Hx     SOCIAL HX:  reports that she has been smoking cigarettes. She started smoking about 38 years ago. She has been smoking about 0.50 packs per day. She has never used smokeless tobacco. She reports current alcohol use. She reports that she does not use drugs.   Current Outpatient Medications:  .  acetaminophen (TYLENOL) 500 MG tablet, Take 500 mg by mouth every 6 (six) hours as needed for mild pain., Disp: , Rfl:  .  ALPRAZolam (XANAX) 0.25 MG tablet, Take 1 tablet (0.25 mg total) by mouth daily. As needed for panic attack, Disp: 20 tablet, Rfl: 0 .  amLODipine (NORVASC) 2.5 MG tablet, Take  1 tablet (2.5 mg total) by mouth daily., Disp: 90 tablet, Rfl: 3 .  Lactobacillus (PROBIOTIC ACIDOPHILUS PO), Take 1 capsule by mouth daily., Disp: , Rfl:  .  omeprazole (PRILOSEC) 40 MG capsule, Take 1 capsule (40 mg total) by mouth 2 (two) times daily. On an empty stomach (Patient taking differently: Take 40 mg by mouth daily as needed (heartburn). On an empty stomach), Disp: 30 capsule, Rfl: 0  EXAM:  VITALS per patient if  applicable:  GENERAL: alert, oriented, appears well and in no acute distress  HEENT: atraumatic, conjunttiva clear, no obvious abnormalities on inspection of external nose and ears  NECK: normal movements of the head and neck  LUNGS: on inspection no signs of respiratory distress, breathing rate appears normal, no obvious gross SOB, gasping or wheezing  CV: no obvious cyanosis  MS: moves all visible extremities without noticeable abnormality  PSYCH/NEURO: pleasant and cooperative, no obvious depression or anxiety, speech and thought processing grossly intact  ASSESSMENT AND PLAN:  Discussed the following assessment and plan:  Essential hypertension  Generalized anxiety disorder with panic attacks  Essential hypertension Excellent control with 2.5 mg amlodipine based on home readings.  No  change  Generalized anxiety disorder with panic attacks Improved with rare prn use of alprazolam and control of blood pressure.  She has deferred SSRI therapy    I discussed the assessment and treatment plan with the patient. The patient was provided an opportunity to ask questions and all were answered. The patient agreed with the plan and demonstrated an understanding of the instructions.   The patient was advised to call back or seek an in-person evaluation if the symptoms worsen or if the condition fails to improve as anticipated.  I provided 30 minutes of non-face-to-face time during this encounter.   Crecencio Mc, MD

## 2020-06-30 ENCOUNTER — Other Ambulatory Visit: Payer: Self-pay

## 2020-06-30 ENCOUNTER — Ambulatory Visit
Admission: RE | Admit: 2020-06-30 | Discharge: 2020-06-30 | Disposition: A | Discharge status: Home / Self Care | Payer: Commercial Managed Care - PPO | Source: Ambulatory Visit | Attending: Internal Medicine | Admitting: Internal Medicine

## 2020-06-30 DIAGNOSIS — Z1231 Encounter for screening mammogram for malignant neoplasm of breast: Secondary | ICD-10-CM | POA: Insufficient documentation

## 2020-08-11 MED ORDER — AMLODIPINE BESYLATE 2.5 MG PO TABS
2.5000 mg | ORAL_TABLET | Freq: Every day | ORAL | 3 refills | Status: DC
Start: 2020-08-11 — End: 2021-07-19

## 2020-09-02 ENCOUNTER — Other Ambulatory Visit: Payer: Self-pay

## 2020-09-02 MED ORDER — ALPRAZOLAM 0.25 MG PO TABS
0.2500 mg | ORAL_TABLET | Freq: Every day | ORAL | 0 refills | Status: DC
Start: 2020-09-02 — End: 2021-04-09

## 2020-09-02 NOTE — Telephone Encounter (Signed)
RX Refill:xanax Last Seen:06-16-20 Last ordered: 06-09-20

## 2021-04-09 ENCOUNTER — Other Ambulatory Visit: Payer: Self-pay

## 2021-04-09 MED ORDER — ALPRAZOLAM 0.25 MG PO TABS
0.2500 mg | ORAL_TABLET | Freq: Every day | ORAL | 0 refills | Status: DC
Start: 2021-04-09 — End: 2021-08-11

## 2021-04-09 NOTE — Telephone Encounter (Signed)
RX Refill:xanax Last Seen:06-16-20 Last ordered:09-02-20

## 2021-05-24 ENCOUNTER — Encounter: Payer: Self-pay | Admitting: Internal Medicine

## 2021-05-24 ENCOUNTER — Ambulatory Visit (INDEPENDENT_AMBULATORY_CARE_PROVIDER_SITE_OTHER): Payer: 59 | Admitting: Internal Medicine

## 2021-05-24 ENCOUNTER — Other Ambulatory Visit: Payer: Self-pay

## 2021-05-24 VITALS — BP 128/84 | HR 85 | Temp 96.1°F | Ht 67.5 in | Wt 163.2 lb

## 2021-05-24 DIAGNOSIS — R053 Chronic cough: Secondary | ICD-10-CM | POA: Diagnosis not present

## 2021-05-24 DIAGNOSIS — Z0000 Encounter for general adult medical examination without abnormal findings: Secondary | ICD-10-CM | POA: Diagnosis not present

## 2021-05-24 DIAGNOSIS — Z23 Encounter for immunization: Secondary | ICD-10-CM | POA: Diagnosis not present

## 2021-05-24 DIAGNOSIS — K2970 Gastritis, unspecified, without bleeding: Secondary | ICD-10-CM | POA: Diagnosis not present

## 2021-05-24 DIAGNOSIS — Z1231 Encounter for screening mammogram for malignant neoplasm of breast: Secondary | ICD-10-CM

## 2021-05-24 DIAGNOSIS — F411 Generalized anxiety disorder: Secondary | ICD-10-CM | POA: Diagnosis not present

## 2021-05-24 DIAGNOSIS — B9681 Helicobacter pylori [H. pylori] as the cause of diseases classified elsewhere: Secondary | ICD-10-CM

## 2021-05-24 DIAGNOSIS — I10 Essential (primary) hypertension: Secondary | ICD-10-CM | POA: Diagnosis not present

## 2021-05-24 DIAGNOSIS — Z720 Tobacco use: Secondary | ICD-10-CM | POA: Diagnosis not present

## 2021-05-24 DIAGNOSIS — Z122 Encounter for screening for malignant neoplasm of respiratory organs: Secondary | ICD-10-CM

## 2021-05-24 DIAGNOSIS — Z1211 Encounter for screening for malignant neoplasm of colon: Secondary | ICD-10-CM | POA: Diagnosis not present

## 2021-05-24 DIAGNOSIS — F410 Panic disorder [episodic paroxysmal anxiety] without agoraphobia: Secondary | ICD-10-CM

## 2021-05-24 MED ORDER — VARENICLINE TARTRATE 0.5 MG X 11 & 1 MG X 42 PO MISC: ORAL | 0 refills | Status: DC

## 2021-05-24 MED ORDER — OMEPRAZOLE 40 MG PO CPDR: 40.0000 mg | DELAYED_RELEASE_CAPSULE | Freq: Every day | ORAL | 2 refills | Status: DC | PRN

## 2021-05-24 NOTE — Progress Notes (Signed)
Patient ID: Carmen Ross, female    DOB: Jun 29, 1961  Age: 60 y.o. MRN: [id number]  The patient is here for annual preventive  examination and management of other chronic and acute problems.  This visit occurred during the SARS-CoV-2 public health emergency.  Safety protocols were in place, including screening questions prior to the visit, additional usage of staff PPE, and extensive cleaning of exam room while observing appropriate contact time as indicated for disinfecting solutions.     The risk factors are reflected in the social history.  The roster of all physicians providing medical care to patient - is listed in the Snapshot section of the chart.  Activities of daily living:  The patient is 100% independent in all ADLs: dressing, toileting, feeding as well as independent mobility  Home safety : The patient has smoke detectors in the home. They wear seatbelts.  There are no firearms at home. There is no violence in the home.   There is no risks for hepatitis, STDs or HIV. There is no   history of blood transfusion. They have no travel history to infectious disease endemic areas of the world.  The patient has seen their dentist in the last six month. They have seen their eye doctor in the last year. They admit to slight hearing difficulty with regard to whispered voices and some television programs.  They have deferred audiologic testing in the last year.  They do not  have excessive sun exposure. Discussed the need for sun protection: hats, long sleeves and use of sunscreen if there is significant sun exposure.   Diet: the importance of a healthy diet is discussed. They do have a healthy diet.  The benefits of regular aerobic exercise were discussed. She walks 4 times per week ,  20 minutes.   Depression screen: there are no signs or vegative symptoms of depression- irritability, change in appetite, anhedonia, sadness/tearfullness.  Cognitive assessment: the patient manages all their  financial and personal affairs and is actively engaged. They could relate day,date,year and events; recalled 2/3 objects at 3 minutes; performed clock-face test normally.  The following portions of the patient's history were reviewed and updated as appropriate: allergies, current medications, past family history, past medical history,  past surgical history, past social history  and problem list.  Visual acuity was not assessed per patient preference since she has regular follow up with her ophthalmologist. Hearing and body mass index were assessed and reviewed.   During the course of the visit the patient was educated and counseled about appropriate screening and preventive services including : fall prevention , diabetes screening, nutrition counseling, colorectal cancer screening, and recommended immunizations.    CC: The primary encounter diagnosis was Encounter for screening mammogram for malignant neoplasm of breast. Diagnoses of Helicobacter pylori gastritis, Colon cancer screening, Essential hypertension, Encounter for preventive health examination, Need for immunization against influenza, Tobacco abuse, Chronic cough, Screening for lung cancer, Routine general medical examination at a health care facility, and Generalized anxiety disorder with panic attacks were also pertinent to this visit.  1) mild gastritis despite omeprazole daily in the morning.  Coffee reduced and it helped.   2) Hypertension: patient checks blood pressure twice weekly at home.  Readings have been for the most part < 130/80 at rest . Patient is following a reduce salt diet most days and is taking medications as prescribed .  History Carmen Ross has a past medical history of Anemia, Chicken pox, Granulosa cell tumor of ovary, right, Headache,  and UTI (lower urinary tract infection).   She has a past surgical history that includes Hernia repair (1968); Cesarean section (1988); Laparoscopic bilateral salpingo oophorectomy  (Bilateral, 08/05/2016); Laparoscopic lysis of adhesions (08/05/2016); and Oophorectomy (Bilateral, 07/2016).   Her family history includes Arthritis in her maternal grandmother and mother; Diabetes in her father; Heart disease in her father; Hyperlipidemia in her father; Hypertension in her father, maternal grandfather, maternal grandmother, and mother.She reports that she has been smoking cigarettes. She started smoking about 39 years ago. She has been smoking an average of .5 packs per day. She has never used smokeless tobacco. She reports current alcohol use. She reports that she does not use drugs.  Outpatient Medications Prior to Visit  Medication Sig Dispense Refill   acetaminophen (TYLENOL) 500 MG tablet Take 500 mg by mouth every 6 (six) hours as needed for mild pain.     ALPRAZolam (XANAX) 0.25 MG tablet Take 1 tablet (0.25 mg total) by mouth daily. As needed for panic attack 20 tablet 0   amLODipine (NORVASC) 2.5 MG tablet Take 1 tablet (2.5 mg total) by mouth daily. 90 tablet 3   omeprazole (PRILOSEC) 40 MG capsule Take 1 capsule (40 mg total) by mouth 2 (two) times daily. On an empty stomach (Patient taking differently: Take 40 mg by mouth daily as needed (heartburn). On an empty stomach) 30 capsule 0   Lactobacillus (PROBIOTIC ACIDOPHILUS PO) Take 1 capsule by mouth daily. (Patient not taking: Reported on 05/24/2021)     No facility-administered medications prior to visit.    Review of Systems  Patient denies headache, fevers, malaise, unintentional weight loss, skin rash, eye pain, sinus congestion and sinus pain, sore throat, dysphagia,  hemoptysis , cough, dyspnea, wheezing, chest pain, palpitations, orthopnea, edema, abdominal pain, nausea, melena, diarrhea, constipation, flank pain, dysuria, hematuria, urinary  Frequency, nocturia, numbness, tingling, seizures,  Focal weakness, Loss of consciousness,  Tremor, insomnia, depression, anxiety, and suicidal ideation.     Objective:   BP 128/84 (BP Location: Left Arm, Patient Position: Sitting, Cuff Size: Normal)   Pulse 85   Temp (!) 96.1 F (35.6 C) (Temporal)   Ht 5' 7.5" (1.715 m)   Wt 163 lb 3.2 oz (74 kg)   LMP 05/23/2015   SpO2 98%   BMI 25.18 kg/m   Physical Exam   General appearance: alert, cooperative and appears stated age Head: Normocephalic, without obvious abnormality, atraumatic Eyes: conjunctivae/corneas clear. PERRL, EOM's intact. Fundi benign. Ears: normal TM's and external ear canals both ears Nose: Nares normal. Septum midline. Mucosa normal. No drainage or sinus tenderness. Throat: lips, mucosa, and tongue normal; teeth and gums normal Neck: no adenopathy, no carotid bruit, no JVD, supple, symmetrical, trachea midline and thyroid not enlarged, symmetric, no tenderness/mass/nodules Lungs: clear to auscultation except for occasional ronchi in the LLL  Breasts: normal appearance, no masses or tenderness Heart: regular rate and rhythm, S1, S2 normal, no murmur, click, rub or gallop Abdomen: soft, non-tender; bowel sounds normal; no masses,  no organomegaly Extremities: extremities normal, atraumatic, no cyanosis or edema Pulses: 2+ and symmetric Skin: Skin color, texture, turgor normal. No rashes or lesions Neurologic: Alert and oriented X 3, normal strength and tone. Normal symmetric reflexes. Normal coordination and gait.    Assessment & Plan:   Problem List Items Addressed This Visit       Unprioritized   Generalized anxiety disorder with panic attacks    Improved with rare prn use of alprazolam and control of blood pressure.  She has deferred SSRI therapy      Essential hypertension    Excellent control with 2.5 mg amlodipine based on home readings.  No  change      Helicobacter pylori gastritis    She continues to have episodes of mild abd pain intermittently that appear to be food related. and is taking a PPI. Recommend adding H2 blocker before coffee,  In the pm.        Relevant Medications   omeprazole (PRILOSEC) 40 MG capsule   Routine general medical examination at a health care facility    age appropriate education and counseling updated, referrals for preventative services and immunizations addressed, dietary and smoking counseling addressed, most recent labs reviewed.  I have personally reviewed and have noted:   1) the patient's medical and social history 2) The pt's use of alcohol, tobacco, and illicit drugs 3) The patient's current medications and supplements 4) Functional ability including ADL's, fall risk, home safety risk, hearing and visual impairment 5) Diet and physical activities 6) Evidence for depression or mood disorder 7) The patient's height, weight, and BMI have been recorded in the chart   I have made referrals, and provided counseling and education based on review of the above      Colon cancer screening   Tobacco abuse    She requests a repeat trial of chantix, which I have authorized following counselling.  I have also recommended lung ca screening initiation with ct       Relevant Orders   CT CHEST LUNG CA SCREEN LOW DOSE W/O CM   Other Visit Diagnoses     Encounter for screening mammogram for malignant neoplasm of breast    -  Primary   Relevant Orders   MM 3D SCREEN BREAST BILATERAL   Encounter for preventive health examination       Relevant Orders   B12 and Folate Panel (Completed)   Comprehensive metabolic panel (Completed)   Lipid panel (Completed)   TSH (Completed)   VITAMIN D 25 Hydroxy (Vit-D Deficiency, Fractures) (Completed)   Need for immunization against influenza       Relevant Orders   Flu Vaccine QUAD 1mo+IM (Fluarix, Fluzone & Alfiuria Quad PF) (Completed)   Chronic cough       Relevant Orders   CT CHEST LUNG CA SCREEN LOW DOSE W/O CM   Screening for lung cancer       Relevant Orders   CT CHEST LUNG CA SCREEN LOW DOSE W/O CM       I have discontinued Valinda Hoar. Cooks's Lactobacillus  (PROBIOTIC ACIDOPHILUS PO). I have also changed her omeprazole. Additionally, I am having her start on varenicline. Lastly, I am having her maintain her acetaminophen, amLODipine, and ALPRAZolam.  Meds ordered this encounter  Medications   omeprazole (PRILOSEC) 40 MG capsule    Sig: Take 1 capsule (40 mg total) by mouth daily as needed (heartburn). On an empty stomach    Dispense:  90 capsule    Refill:  2   varenicline (CHANTIX STARTING MONTH PAK) 0.5 MG X 11 & 1 MG X 42 tablet    Sig: Take one 0.5 mg tablet by mouth once daily for 3 days, then increase to one 0.5 mg tablet twice daily for 4 days, then increase to one 1 mg tablet twice daily.    Dispense:  53 tablet    Refill:  0    Medications Discontinued During This Encounter  Medication Reason   Lactobacillus (PROBIOTIC  ACIDOPHILUS PO)    omeprazole (PRILOSEC) 40 MG capsule Reorder    Follow-up: No follow-ups on file.   Crecencio Mc, MD

## 2021-05-24 NOTE — Patient Instructions (Signed)
Ok to add 20 mg famotidine up to 2 times daily for gastritis symptoms  Your annual mammogram has been ordered.  You are encouraged (required) to call to make your appointment at [facility]    Agree with starting chantix  rx sent

## 2021-05-25 LAB — COMPREHENSIVE METABOLIC PANEL
ALT: 15 IU/L (ref 0–32)
AST: 18 IU/L (ref 0–40)
Albumin/Globulin Ratio: 1.9 (ref 1.2–2.2)
Albumin: 4.5 g/dL (ref 3.8–4.9)
Alkaline Phosphatase: 87 IU/L (ref 44–121)
BUN/Creatinine Ratio: 14 (ref 12–28)
BUN: 10 mg/dL (ref 8–27)
Bilirubin Total: 0.3 mg/dL (ref 0.0–1.2)
CO2: 26 mmol/L (ref 20–29)
Calcium: 9.9 mg/dL (ref 8.7–10.3)
Chloride: 101 mmol/L (ref 96–106)
Creatinine, Ser: 0.73 mg/dL (ref 0.57–1.00)
Globulin, Total: 2.4 g/dL (ref 1.5–4.5)
Glucose: 90 mg/dL (ref 70–99)
Potassium: 4 mmol/L (ref 3.5–5.2)
Sodium: 141 mmol/L (ref 134–144)
Total Protein: 6.9 g/dL (ref 6.0–8.5)
eGFR: 94 mL/min/{1.73_m2} (ref >59)

## 2021-05-25 LAB — VITAMIN D 25 HYDROXY (VIT D DEFICIENCY, FRACTURES): Vit D, 25-Hydroxy: 36.4 ng/mL (ref 30.0–100.0)

## 2021-05-25 LAB — LIPID PANEL
Chol/HDL Ratio: 3.2 ratio (ref 0.0–4.4)
Cholesterol, Total: 218 mg/dL — ABNORMAL HIGH (ref 100–199)
HDL: 68 mg/dL (ref >39)
LDL Chol Calc (NIH): 134 mg/dL — ABNORMAL HIGH (ref 0–99)
Triglycerides: 90 mg/dL (ref 0–149)
VLDL Cholesterol Cal: 16 mg/dL (ref 5–40)

## 2021-05-25 LAB — B12 AND FOLATE PANEL
Folate: >20 ng/mL (ref >3.0)
Vitamin B-12: 447 pg/mL (ref 232–1245)

## 2021-05-25 LAB — TSH: TSH: 0.792 u[IU]/mL (ref 0.450–4.500)

## 2021-05-25 NOTE — Assessment & Plan Note (Signed)

## 2021-05-25 NOTE — Assessment & Plan Note (Signed)
Improved with rare prn use of alprazolam and control of blood pressure.  She has deferred SSRI therapy

## 2021-05-25 NOTE — Assessment & Plan Note (Signed)
Excellent control with 2.5 mg amlodipine based on home readings.  No  change

## 2021-05-25 NOTE — Addendum Note (Signed)
Addended by: Crecencio Mc on: 05/25/2021 05:22 PM   Modules accepted: Orders

## 2021-05-25 NOTE — Assessment & Plan Note (Signed)
She continues to have episodes of mild abd pain intermittently that appear to be food related. and is taking a PPI. Recommend adding H2 blocker before coffee,  In the pm.

## 2021-05-25 NOTE — Assessment & Plan Note (Signed)
She requests a repeat trial of chantix, which I have authorized following counselling.  I have also recommended lung ca screening initiation with ct

## 2021-07-12 ENCOUNTER — Other Ambulatory Visit: Payer: Self-pay

## 2021-07-12 ENCOUNTER — Ambulatory Visit
Admission: RE | Admit: 2021-07-12 | Discharge: 2021-07-12 | Disposition: A | Discharge status: Home / Self Care | Payer: 59 | Source: Ambulatory Visit | Attending: Internal Medicine | Admitting: Internal Medicine

## 2021-07-12 DIAGNOSIS — Z1231 Encounter for screening mammogram for malignant neoplasm of breast: Secondary | ICD-10-CM | POA: Insufficient documentation

## 2021-07-18 ENCOUNTER — Other Ambulatory Visit: Payer: Self-pay | Admitting: Internal Medicine

## 2021-08-11 ENCOUNTER — Other Ambulatory Visit: Payer: Self-pay

## 2021-08-11 ENCOUNTER — Other Ambulatory Visit: Payer: Self-pay | Admitting: Internal Medicine

## 2021-08-11 ENCOUNTER — Encounter: Payer: Self-pay | Admitting: Internal Medicine

## 2021-08-11 DIAGNOSIS — Z87891 Personal history of nicotine dependence: Secondary | ICD-10-CM

## 2021-08-11 DIAGNOSIS — F17210 Nicotine dependence, cigarettes, uncomplicated: Secondary | ICD-10-CM

## 2021-08-11 MED ORDER — ALPRAZOLAM 0.25 MG PO TABS: 0.2500 mg | ORAL_TABLET | Freq: Every day | ORAL | 3 refills | Status: DC

## 2021-08-11 NOTE — Telephone Encounter (Signed)
Refilled: 04/09/2021. Last OV: 05/24/2021 Next OV: 05/26/2022

## 2021-08-13 ENCOUNTER — Other Ambulatory Visit: Payer: Self-pay | Admitting: Internal Medicine

## 2021-08-13 MED ORDER — VARENICLINE TARTRATE 1 MG PO TABS: 1.0000 mg | ORAL_TABLET | Freq: Two times a day (BID) | ORAL | 5 refills | Status: DC

## 2021-08-31 ENCOUNTER — Encounter: Payer: Self-pay | Admitting: Acute Care

## 2021-08-31 ENCOUNTER — Ambulatory Visit (INDEPENDENT_AMBULATORY_CARE_PROVIDER_SITE_OTHER): Payer: 59 | Admitting: Acute Care

## 2021-08-31 ENCOUNTER — Other Ambulatory Visit: Payer: Self-pay

## 2021-08-31 DIAGNOSIS — F17210 Nicotine dependence, cigarettes, uncomplicated: Secondary | ICD-10-CM | POA: Diagnosis not present

## 2021-08-31 NOTE — Progress Notes (Signed)
Virtual Visit via Telephone Note  I connected with Carmen Ross on 06/22/21 at  2:00 PM EST by telephone and verified that I am speaking with the correct person using two identifiers.  Location: Patient: Home Provider: Working from Home   I discussed the limitations, risks, security and privacy concerns of performing an evaluation and management service by telephone and the availability of in person appointments. I also discussed with the patient that there may be a patient responsible charge related to this service. The patient expressed understanding and agreed to proceed.  Shared Decision Making Visit Lung Cancer Screening Program [phone])   Eligibility: Age 61 y.o. Pack Years Smoking History Calculation 24 (# packs/per year x # years smoked) Recent History of coughing up blood  no Unexplained weight loss? no ( >Than 15 pounds within the last 6 months ) Prior History Lung / other cancer no (Diagnosis within the last 5 years already requiring surveillance chest CT Scans). Smoking Status Current Smoker Former Smokers: Years since quit: NA  Quit Date: NA  Visit Components: Discussion included one or more decision making aids. yes Discussion included risk/benefits of screening. yes Discussion included potential follow up diagnostic testing for abnormal scans. yes Discussion included meaning and risk of over diagnosis. yes Discussion included meaning and risk of False Positives. yes Discussion included meaning of total radiation exposure. yes  Counseling Included: Importance of adherence to annual lung cancer LDCT screening. yes Impact of comorbidities on ability to participate in the program. yes Ability and willingness to under diagnostic treatment. yes  Smoking Cessation Counseling: Current Smokers:  Discussed importance of smoking cessation. yes Information about tobacco cessation classes and interventions provided to patient. yes Patient provided with "ticket" for LDCT  Scan. yes Symptomatic Patient. yes  Counseling(Intermediate counseling: > three minutes) [id number] Diagnosis Code: Tobacco Use Z72.0 Asymptomatic Patient NA  Counseling NA Former Smokers:  Discussed the importance of maintaining cigarette abstinence. yes Diagnosis Code: Personal History of Nicotine Dependence. I43.329 Information about tobacco cessation classes and interventions provided to patient. Yes Patient provided with "ticket" for LDCT Scan. yes Written Order for Lung Cancer Screening with LDCT placed in Epic. Yes (CT Chest Lung Cancer Screening Low Dose W/O CM) [id number] Z12.2-Screening of respiratory organs Z87.891-Personal history of nicotine dependence   I spent 25 minutes of face to face time with her discussing the risks and benefits of lung cancer screening. We viewed a power point together that explained in detail the above noted topics. We took the time to pause the power point at intervals to allow for questions to be asked and answered to ensure understanding. We discussed that she had taken the single most powerful action possible to decrease her risk of developing lung cancer when she quit smoking. I counseled her to remain smoke free, and to contact me if me ever had the desire to smoke again so that I can provide resources and tools to help support the effort to remain smoke free. We discussed the time and location of the scan, and that either  [name] or I will call with the results within  24-48 hours of receiving them. She has my card and contact information in the event she needs to speak with me, in addition to a copy of the power point we reviewed as a resource. She verbalized understanding of all of the above and had no further questions upon leaving the office.     I explained to the patient that there has been a  high incidence of coronary artery disease noted on these exams. I explained that this is a non-gated exam therefore degree or severity cannot be  determined. This patient is not on statin therapy. I have asked the patient to follow-up with their PCP regarding any incidental finding of coronary artery disease and management with diet or medication as they feel is clinically indicated. The patient verbalized understanding of the above and had no further questions.  Carmen Ross D. Carmen Kingfisher, NP-C [facility] Personal contact information can be found on Amion  08/31/2021, 12:12 PM

## 2021-08-31 NOTE — Patient Instructions (Signed)
Thank you for participating in the [place] [facility] Screening Program. °It was our pleasure to meet you today. °We will call you with the results of your scan within the next few days. °Your scan will be assigned a Lung RADS category score by the physicians reading the scans.  °This Lung RADS score determines follow up scanning.  °See below for description of categories, and follow up screening recommendations. °We will be in touch to schedule your follow up screening annually or based on recommendations of our providers. °We will fax a copy of your scan results to your Primary Care Physician, or the physician who referred you to the program, to ensure they have the results. °Please call the office if you have any questions or concerns regarding your scanning experience or results.  °Our office number is [phone]. °Please speak with Denise Phelps, RN. She is our Lung Cancer Screening RN. °If she is unavailable when you call, please have the office staff send her a message. She will return your call at her earliest convenience. °Remember, if your scan is normal, we will scan you annually as long as you continue to meet the criteria for the program. (Age 55-77, Current smoker or smoker who has quit within the last 15 years). °If you are a smoker, remember, quitting is the single most powerful action that you can take to decrease your risk of lung cancer and other pulmonary, breathing related problems. °We know quitting is hard, and we are here to help.  °Please let us know if there is anything we can do to help you meet your goal of quitting. °If you are a former smoker, congratulations. We are proud of you! Remain smoke free! °Remember you can refer friends or family members through the number above.  °We will screen them to make sure they meet criteria for the program. °Thank you for helping us take better care of you by participating in Lung Screening. ° °You can receive free nicotine replacement therapy  ( patches, gum or mints) by calling 1-800-QUIT NOW. Please call so we can get you on the path to becoming  a non-smoker. I know it is hard, but you can do this! ° °Lung RADS Categories: ° °Lung RADS 1: no nodules or definitely non-concerning nodules.  °Recommendation is for a repeat annual scan in 12 months. ° °Lung RADS 2:  nodules that are non-concerning in appearance and behavior with a very low likelihood of becoming an active cancer. °Recommendation is for a repeat annual scan in 12 months. ° °Lung RADS 3: nodules that are probably non-concerning , includes nodules with a low likelihood of becoming an active cancer.  Recommendation is for a 6-month repeat screening scan. Often noted after an upper respiratory illness. We will be in touch to make sure you have no questions, and to schedule your 6-month scan. ° °Lung RADS 4 A: nodules with concerning findings, recommendation is most often for a follow up scan in 3 months or additional testing based on our provider's assessment of the scan. We will be in touch to make sure you have no questions and to schedule the recommended 3 month follow up scan. ° °Lung RADS 4 B:  indicates findings that are concerning. We will be in touch with you to schedule additional diagnostic testing based on our provider's  assessment of the scan. ° °Hypnosis for smoking cessation  °[employer] °[phone] ° °Acupuncture for smoking cessation  °[facility] °[phone]  °

## 2021-09-01 ENCOUNTER — Other Ambulatory Visit: Payer: Self-pay

## 2021-09-01 ENCOUNTER — Ambulatory Visit
Admission: RE | Admit: 2021-09-01 | Discharge: 2021-09-01 | Disposition: A | Discharge status: Home / Self Care | Payer: 59 | Source: Ambulatory Visit | Attending: Acute Care | Admitting: Acute Care

## 2021-09-01 DIAGNOSIS — Z87891 Personal history of nicotine dependence: Secondary | ICD-10-CM | POA: Diagnosis present

## 2021-09-01 DIAGNOSIS — F17210 Nicotine dependence, cigarettes, uncomplicated: Secondary | ICD-10-CM | POA: Diagnosis present

## 2021-09-03 ENCOUNTER — Telehealth: Payer: Self-pay | Admitting: Acute Care

## 2021-09-03 NOTE — Telephone Encounter (Signed)
Received call report from [facility] with [facility] on patient's CT Lung Cancer Screen done on 09/01/21. Sarah please review the result/impression copied below:  IMPRESSION: Lung-RADS 4B, suspicious. Additional imaging evaluation or consultation with Pulmonology or Thoracic Surgery recommended. Asymmetric ill-defined opacity in the right lung apex is probably scarring but given the asymmetry and somewhat nodular character on this initial baseline study, close follow-up recommended. While the lesion technically meets criteria for lung rads 4B, a repeat lung cancer screening CT in 3 months would likely be a reasonable option for follow-up.   4.5 cm diameter ascending thoracic aorta. Ascending thoracic aortic aneurysm. Recommend semi-annual imaging followup by CTA or MRA and referral to cardiothoracic surgery if not already obtained. This recommendation follows 2010 ACCF/AHA/AATS/ACR/ASA/SCA/SCAI/SIR/STS/SVM Guidelines for the Diagnosis and Management of Patients With Thoracic Aortic Disease. Circulation. 2010; 121: J505-X833. Aortic aneurysm NOS (ICD10-I71.9)   Emphysema. (POI51-G98.9)  Please advise, thank you.

## 2021-09-06 ENCOUNTER — Telehealth: Payer: Self-pay | Admitting: Acute Care

## 2021-09-06 ENCOUNTER — Other Ambulatory Visit: Payer: Self-pay | Admitting: Acute Care

## 2021-09-06 DIAGNOSIS — I2541 Coronary artery aneurysm: Secondary | ICD-10-CM

## 2021-09-06 NOTE — Telephone Encounter (Signed)
Pt returned called to Maggie Schwalbe regarding CT results.

## 2021-09-06 NOTE — Telephone Encounter (Signed)
I have called the patient with the results of her low dose CT Chest. It was read as a Lung RADS 4 B indicates suspicious findings , but radiology recommend a 3 month follow up as it may be scarring. She is in agreement with this.  Additionally, the patient had notation of an   Ascending thoracic aorta measures 4.5 cm diameter. She does have hx of HTN and she is on her antihypertensive medications. We will refer her to TCTS for monitoring of her aneurysm/

## 2021-09-07 ENCOUNTER — Other Ambulatory Visit: Payer: Self-pay

## 2021-09-07 DIAGNOSIS — R911 Solitary pulmonary nodule: Secondary | ICD-10-CM

## 2021-09-07 DIAGNOSIS — F17210 Nicotine dependence, cigarettes, uncomplicated: Secondary | ICD-10-CM

## 2021-09-07 DIAGNOSIS — R918 Other nonspecific abnormal finding of lung field: Secondary | ICD-10-CM

## 2021-09-07 DIAGNOSIS — Z87891 Personal history of nicotine dependence: Secondary | ICD-10-CM

## 2021-09-07 NOTE — Telephone Encounter (Signed)
Order placed for 3 month LCS nodule follow up CT

## 2021-09-09 ENCOUNTER — Other Ambulatory Visit: Payer: Self-pay | Admitting: Internal Medicine

## 2021-09-09 DIAGNOSIS — R918 Other nonspecific abnormal finding of lung field: Secondary | ICD-10-CM

## 2021-10-12 ENCOUNTER — Other Ambulatory Visit: Payer: Self-pay

## 2021-10-12 ENCOUNTER — Encounter: Payer: Self-pay | Admitting: Thoracic Surgery (Cardiothoracic Vascular Surgery)

## 2021-10-12 ENCOUNTER — Institutional Professional Consult (permissible substitution): Payer: 59 | Admitting: Thoracic Surgery (Cardiothoracic Vascular Surgery)

## 2021-10-12 VITALS — BP 150/88 | HR 86 | Resp 20 | Ht 67.5 in | Wt 162.0 lb

## 2021-10-12 DIAGNOSIS — I7121 Aneurysm of the ascending aorta, without rupture: Secondary | ICD-10-CM | POA: Diagnosis not present

## 2021-10-12 DIAGNOSIS — C569 Malignant neoplasm of unspecified ovary: Secondary | ICD-10-CM | POA: Insufficient documentation

## 2021-10-12 NOTE — Progress Notes (Signed)
PCP is Crecencio Mc, MD Referring Provider is Magdalen Spatz, NP  Chief Complaint  Patient presents with   Thoracic Aortic Aneurysm    Surgical consult, Chest CT 09/01/21    HPI: Carmen Ross is sent for consultation regarding a ascending thoracic aneurysm and lung nodule.  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  She smoked about a pack of cigarettes daily since she was 61 years old.  She is trying to quit.  She recently had an annual wellness visit with Dr. Derrel Nip.  She was referred for a low-dose CT for lung cancer screening.  There was a RADS 4B lesion in the apex of the right upper lobe.  She was also noted to have a 4.5 cm ascending aneurysm.  She does have heartburn due to reflux.  She is on medication for that her symptoms are controlled.  She is not having any other chest pain, pressure, or tightness.  She is not having any shortness of breath.  She is currently on Chantix trying to quit smoking.  Past Medical History:  Diagnosis Date   Anemia    heavy menstral pepriods 2 years ago, no periods for 14 months.    Chicken pox    Granulosa cell tumor of ovary, right    Headache    sinus related to weather changes   UTI (lower urinary tract infection)     Past Medical History:  Diagnosis Date   Anemia    heavy menstral pepriods 2 years ago, no periods for 14 months.    Chicken pox    Granulosa cell tumor of ovary, right    Headache    sinus related to weather changes   UTI (lower urinary tract infection)     Past Surgical History:  Procedure Laterality Date   [place] OOPHERECTOMY Bilateral 08/05/2016   Procedure: LAPAROSCOPIC BILATERAL SALPINGO OOPHORECTOMY;  Surgeon: Boykin Nearing, MD;  Location: [facility] ORS;  Service: Gynecology;  Laterality: Bilateral;   LAPAROSCOPIC  LYSIS OF ADHESIONS  08/05/2016   Procedure: LAPAROSCOPIC LYSIS OF ADHESIONS;  Surgeon: Boykin Nearing, MD;  Location: [facility] ORS;  Service: Gynecology;;   OOPHORECTOMY Bilateral 07/2016    Family History  Problem Relation Age of Onset   Arthritis Mother    Hypertension Mother    Hyperlipidemia Father    Heart disease Father    Hypertension Father    Diabetes Father    Hypertension Maternal Grandmother    Arthritis Maternal Grandmother    Hypertension Maternal Grandfather    Breast cancer Neg Hx     Social History Social History   Tobacco Use   Smoking status: Every Day    Packs/day: 0.60    Years: 39.00    Pack years: 23.40    Types: Cigarettes    Start date: 03/27/1982   Smokeless tobacco: Never   Tobacco comments:    Currently on Chantix   Substance Use Topics   Alcohol use: Yes    Comment: occasional very rare glass of wine with holiday, 5-6 per year   Drug use: No    Current Outpatient Medications  Medication Sig Dispense Refill   ALPRAZolam (XANAX) 0.25 MG tablet Take 1 tablet (0.25 mg total) by mouth daily. As needed for panic attack 20 tablet 3   amLODipine (NORVASC) 2.5 MG tablet TAKE  1 TABLET BY MOUTH  DAILY 90 tablet 3   omeprazole (PRILOSEC) 40 MG capsule Take 1 capsule (40 mg total) by mouth daily as needed (heartburn). On an empty stomach 90 capsule 2   varenicline (CHANTIX) 1 MG tablet Take 1 tablet (1 mg total) by mouth 2 (two) times daily. 60 tablet 5   acetaminophen (TYLENOL) 500 MG tablet Take 500 mg by mouth every 6 (six) hours as needed for mild pain. (Patient not taking: Reported on 10/12/2021)     No current facility-administered medications for this visit.    Allergies  Allergen Reactions   Tape Rash    Review of Systems  Constitutional:  Negative for activity change, appetite change, fatigue and unexpected weight change.  HENT:  Negative for voice change.   Respiratory:  Negative for shortness of breath and wheezing.    Cardiovascular:  Negative for chest pain and leg swelling.  Gastrointestinal:  Positive for abdominal pain (Reflux).  Musculoskeletal:  Positive for myalgias (Leg cramps).  Hematological:  Does not bruise/bleed easily.  Psychiatric/Behavioral:  The patient is nervous/anxious.   All other systems reviewed and are negative.  BP (!) 150/88    Pulse 86    Resp 20    Ht 5' 7.5" (1.715 m)    Wt 162 lb (73.5 kg)    LMP 05/23/2015    SpO2 95% Comment: RA   BMI 25.00 kg/m  Physical Exam Vitals reviewed.  Constitutional:      General: She is not in acute distress.    Appearance: Normal appearance.  HENT:     Head: Normocephalic and atraumatic.  Eyes:     General: No scleral icterus.    Extraocular Movements: Extraocular movements intact.  Neck:     Vascular: No carotid bruit.  Cardiovascular:     Rate and Rhythm: Normal rate and regular rhythm.     Pulses: Normal pulses.     Heart sounds: Normal heart sounds. No murmur heard.   No friction rub. No gallop.  Pulmonary:     Effort: Pulmonary effort is normal. No respiratory distress.     Breath sounds: Normal breath sounds. No wheezing or rales.  Abdominal:     General: There is no distension.     Palpations: Abdomen is soft.  Lymphadenopathy:     Cervical: No cervical adenopathy.  Skin:    General: Skin is warm and dry.  Neurological:     General: No focal deficit present.     Mental Status: She is alert and oriented to person, place, and time.     Cranial Nerves: No cranial nerve deficit.     Motor: No weakness.     Diagnostic Tests: CT CHEST WITHOUT CONTRAST LOW-DOSE FOR LUNG CANCER SCREENING   TECHNIQUE: Multidetector CT imaging of the chest was performed following the standard protocol without IV contrast.   RADIATION DOSE REDUCTION: This exam was performed according to the departmental dose-optimization program which includes automated exposure control, adjustment of the mA and/or kV according to patient size and/or  use of iterative reconstruction technique.   COMPARISON:  None.   FINDINGS: Cardiovascular: The heart size is normal. No substantial pericardial effusion. Ascending thoracic aorta measures 4.5 cm diameter.   Mediastinum/Nodes: No mediastinal lymphadenopathy. No evidence for gross hilar lymphadenopathy although assessment is limited by the lack of intravenous contrast on the current study. The esophagus has normal imaging features. There is no axillary lymphadenopathy.   Lungs/Pleura: Centrilobular and paraseptal emphysema evident. Biapical pleuroparenchymal opacity evident  although appearance is more nodular and prominent in the right apex than the left. No other suspicious pulmonary nodule or mass. No focal airspace consolidation. No pleural effusion.   Upper Abdomen: Unremarkable.   Musculoskeletal: No worrisome lytic or sclerotic osseous abnormality.   IMPRESSION: Lung-RADS 4B, suspicious. Additional imaging evaluation or consultation with Pulmonology or Thoracic Surgery recommended. Asymmetric ill-defined opacity in the right lung apex is probably scarring but given the asymmetry and somewhat nodular character on this initial baseline study, close follow-up recommended. While the lesion technically meets criteria for lung rads 4B, a repeat lung cancer screening CT in 3 months would likely be a reasonable option for follow-up.   4.5 cm diameter ascending thoracic aorta. Ascending thoracic aortic aneurysm. Recommend semi-annual imaging followup by CTA or MRA and referral to cardiothoracic surgery if not already obtained. This recommendation follows 2010 ACCF/AHA/AATS/ACR/ASA/SCA/SCAI/SIR/STS/SVM Guidelines for the Diagnosis and Management of Patients With Thoracic Aortic Disease. Circulation. 2010; 121: Q330-Q762. Aortic aneurysm NOS (ICD10-I71.9)   Emphysema. (UQJ33-L45.9)   These results will be called to the ordering clinician or representative by the Radiologist  Assistant, and communication documented in the PACS or [employer].     Electronically Signed   By: Misty Stanley M.D.   On: 09/03/2021 08:36 I personally reviewed the CT images.  There is an ascending aortic aneurysm.  Measuring perpendicular to the line of flow again its size of about 4.2-4.3 cm.  Also opacity at the right apex.  Impression: Carmen Ross is a 61 year old woman with a history of tobacco abuse, hypertension, ovarian cancer, and reflux.  She recently had a low-dose screening CT for lung cancer screening.  There was ill-defined opacity at the right apex.  She also was noted to have an ascending aneurysm.  Ascending aneurysm-by report 4.5 cm.  I think is really probably more like 4.2 or 4.3 cm.  A CT scan with contrast would be helpful to get a more precise measurement.  She is scheduled to have another CT in 3 months.  We can do that with IV contrast.  That will determine whether she will need annual or semiannual follow-up.  The importance of blood pressure control was emphasized.  Also the importance of tobacco cessation.  Symptoms of dissection were discussed.  She knows to seek medical attention immediately.  Hypertension-blood pressure elevated today at 625 systolic.  She had some issues with her drive over and finding parking.  She does check her self at home and typically runs in the normal range.  She is compliant with medications.  Advised her to keep a close eye on that.  Tobacco abuse-currently in the process of trying to quit.  She is on Chantix.  Importance of tobacco cessation was emphasized.  Plan: Return in 3 months with CT angiogram for follow-up opacity in right apex and ascending aneurysm.  We will then determine the interval follow-up for aneurysm going forward.  Melrose Nakayama, MD [facility] [phone]

## 2021-11-09 ENCOUNTER — Telehealth: Payer: Self-pay

## 2021-11-09 NOTE — Telephone Encounter (Signed)
Left voicemail and call back number for patient to call to schedule follow up CT ?

## 2021-11-12 ENCOUNTER — Ambulatory Visit: Payer: 59 | Admitting: Pulmonary Disease

## 2021-11-12 ENCOUNTER — Encounter: Payer: Self-pay | Admitting: Pulmonary Disease

## 2021-11-12 VITALS — BP 124/78 | HR 78 | Temp 97.8°F | Ht 67.5 in | Wt 175.6 lb

## 2021-11-12 DIAGNOSIS — R9389 Abnormal findings on diagnostic imaging of other specified body structures: Secondary | ICD-10-CM

## 2021-11-12 DIAGNOSIS — F17210 Nicotine dependence, cigarettes, uncomplicated: Secondary | ICD-10-CM

## 2021-11-12 DIAGNOSIS — I7121 Aneurysm of the ascending aorta, without rupture: Secondary | ICD-10-CM | POA: Diagnosis not present

## 2021-11-12 NOTE — Progress Notes (Signed)
Subjective:    Patient ID: Carmen Ross, female    DOB: 23-Mar-1961, 61 y.o.   MRN: [id number] Patient Care Team: Sherlene Shams, MD as PCP - General (Internal Medicine) Loreli Slot, MD as Consulting Physician (Cardiothoracic Surgery)  Chief Complaint  Patient presents with   pulmonary consult    CT scheduled 12/02/21--no current sx.   HPI Patient is a 61 year old current smoker with a 39-pack-year history of smoking history as noted below, who presents for evaluation of a abnormality on low-dose cancer screening CT.  She is kindly referred by Dr. Duncan Dull.  The patient had a low-dose cancer screening CT on 01 September 2021.  This was read as a lung RADS 4B, suspicious.  It was noted that she had asymmetric ill-defined opacity in the right lung apex which was probably scarring but given the asymmetry and somewhat nodular character close follow-up was recommended.  A follow-up CT in 3 months was recommended and the patient is scheduled to have this study done on 27 April.  In addition this study showed a 4.5 cm diameter ascending thoracic aortic aneurysm.  The patient will need semiannual imaging with CT angio.  She is already being followed by Dr. Andrey Spearman.  With regards to the findings on her chest CT she has been entirely asymptomatic.  She has not had any weight loss or anorexia.  No cough or sputum production.  No shortness of breath.  She does not note any other symptomatology.  As noted she is still smoking.  Smokes approximately half a pack of cigarettes per day.  She did however set a quit date and is to start Chantix.  Overall she feels well and looks well.  Review of Systems A 10 point review of systems was performed and it is as noted above otherwise negative.  Past Medical History:  Diagnosis Date   Anemia    heavy menstral pepriods 2 years ago, no periods for 14 months.    Chicken pox    Granulosa cell tumor of ovary, right    Headache    sinus  related to weather changes   UTI (lower urinary tract infection)    Past Surgical History:  Procedure Laterality Date   CESAREAN SECTION  1988   HERNIA REPAIR  1968   LAPAROSCOPIC BILATERAL SALPINGO OOPHERECTOMY Bilateral 08/05/2016   Procedure: LAPAROSCOPIC BILATERAL SALPINGO OOPHORECTOMY;  Surgeon: Suzy Bouchard, MD;  Location: [facility] ORS;  Service: Gynecology;  Laterality: Bilateral;   LAPAROSCOPIC LYSIS OF ADHESIONS  08/05/2016   Procedure: LAPAROSCOPIC LYSIS OF ADHESIONS;  Surgeon: Suzy Bouchard, MD;  Location: [facility] OR[facility];  Service: Gynecology;;   OOPHORECTOMY Bilateral 07/2016   Patient Active Problem List   Diagnosis Date Noted   Ovarian cancer (HCC) 10/12/2021   Colon cancer screening 05/24/2021   Generalized anxiety disorder with panic attacks 06/10/2020   Essential hypertension 05/23/2020   Helicobacter pylori gastritis 03/15/2016   Tobacco abuse counseling 07/05/2013   Tobacco abuse 07/05/2013   Routine general medical examination at a health care facility 03/28/2013   Family History  Problem Relation Age of Onset   Arthritis Mother    Hypertension Mother    Hyperlipidemia Father    Heart disease Father    Hypertension Father    Diabetes Father    Hypertension Maternal Grandmother    Arthritis Maternal Grandmother    Hypertension Maternal Grandfather    Breast cancer Neg Hx    Social History   Tobacco Use  Smoking status: Every Day    Packs/day: 1.00    Years: 39.00    Pack years: 39.00    Types: Cigarettes    Start date: 03/27/1982   Smokeless tobacco: Never   Tobacco comments:    Currently on Chantix--0.5PPD 11/22/2021  Substance Use Topics   Alcohol use: Yes    Comment: occasional very rare glass of wine with holiday, 5-6 per year   Allergies  Allergen Reactions   Tape Rash   Current Meds  Medication Sig   ALPRAZolam (XANAX) 0.25 MG tablet Take 1 tablet (0.25 mg total) by mouth daily. As needed for panic attack   amLODipine  (NORVASC) 2.5 MG tablet TAKE 1 TABLET BY MOUTH  DAILY   omeprazole (PRILOSEC) 40 MG capsule Take 1 capsule (40 mg total) by mouth daily as needed (heartburn). On an empty stomach   varenicline (CHANTIX) 1 MG tablet Take 1 tablet (1 mg total) by mouth 2 (two) times daily.   Immunization History  Administered Date(s) Administered   Influenza Split 05/12/2014, 05/08/2018   Influenza,inj,Quad PF,6+ Mos 05/24/2021   Influenza-Unspecified 05/13/2015, 05/06/2020, 05/14/2020   Moderna SARS-COV2 Booster Vaccination 05/28/2021   Moderna Sars-Covid-2 Vaccination 10/04/2019, 11/01/2019, 06/04/2020   Tdap 05/22/2020       Objective:   Physical Exam BP 124/78 (BP Location: Left Arm, Cuff Size: Normal)   Pulse 78   Temp 97.8 F (36.6 C) (Temporal)   Ht 5' 7.5" (1.715 m)   Wt 175 lb 9.6 oz (79.7 kg)   LMP 05/23/2015   SpO2 100%   BMI 27.10 kg/m  GENERAL: Well-developed, well-nourished woman, fit appearing, fully ambulatory. HEAD: Normocephalic, atraumatic.  EYES: Pupils equal, round, reactive to light.  No scleral icterus.  MOUTH: Dentition intact.  Oral mucosa moist.  No thrush. NECK: Supple. No thyromegaly. Trachea midline. No JVD.  No adenopathy. PULMONARY: Good air entry bilaterally.  No adventitious sounds. CARDIOVASCULAR: S1 and S2. Regular rate and rhythm.  No rubs, murmurs or gallops heard. ABDOMEN: Benign. MUSCULOSKELETAL: No joint deformity, no clubbing, no edema.  NEUROLOGIC: No overt focal deficit, no gait disturbance, speech is fluent. SKIN: Intact,warm,dry. PSYCH: Mood and behavior normal.  Representative image from the LDCT performed 01 September 2021, independently reviewed:     Assessment & Plan:     ICD-10-CM   1. Abnormal CT of the chest  R93.89    Patient has follow-up CT scheduled for April 27 Will review at that time Suspect changes are due to pleural-parenchymal scarring    2. Aneurysm of ascending aorta without rupture [facility])  I71.21    Patient is following up  with thoracic surgery    3. Moderate smoker (20 or less per day)  F17.210    Patient counseled regards discontinuation of smoking Total counseling time 3 to 5 minutes     Reviewed the patient's follow-up CT on 27 April.  If the findings at that time are more consistent with pleural-parenchymal scarring the patient may be removed from lung cancer screening program as she will need semiannual CT angio chest for her thoracic aneurysm.  This would reduce duplication of testing.  Will see the patient in follow-up in 2 months time she is to contact us prior to that time should any new difficulties arise.  Gailen Shelter, MD Advanced Bronchoscopy PCCM [facility]-    *This note was dictated using voice recognition software/Dragon.  Despite best efforts to proofread, errors can occur which can change the meaning. Any transcriptional errors that result  from this process are unintentional and may not be fully corrected at the time of dictation.

## 2021-11-12 NOTE — Patient Instructions (Signed)
We will see you in follow-up in 2 months time. ? ?We will notify if there is any changes that need attention on the follow-up CT to be performed on 26 April. ?

## 2021-12-01 ENCOUNTER — Ambulatory Visit: Payer: 59

## 2021-12-02 ENCOUNTER — Ambulatory Visit
Admission: RE | Admit: 2021-12-02 | Discharge: 2021-12-02 | Disposition: A | Discharge status: Home / Self Care | Payer: 59 | Source: Ambulatory Visit | Attending: Acute Care | Admitting: Acute Care

## 2021-12-02 DIAGNOSIS — R911 Solitary pulmonary nodule: Secondary | ICD-10-CM | POA: Insufficient documentation

## 2021-12-02 DIAGNOSIS — F17210 Nicotine dependence, cigarettes, uncomplicated: Secondary | ICD-10-CM | POA: Diagnosis present

## 2021-12-02 DIAGNOSIS — Z87891 Personal history of nicotine dependence: Secondary | ICD-10-CM | POA: Insufficient documentation

## 2021-12-09 ENCOUNTER — Telehealth: Payer: Self-pay | Admitting: Acute Care

## 2021-12-09 ENCOUNTER — Other Ambulatory Visit: Payer: Self-pay

## 2021-12-09 DIAGNOSIS — Z122 Encounter for screening for malignant neoplasm of respiratory organs: Secondary | ICD-10-CM

## 2021-12-09 DIAGNOSIS — Z87891 Personal history of nicotine dependence: Secondary | ICD-10-CM

## 2021-12-09 DIAGNOSIS — F17210 Nicotine dependence, cigarettes, uncomplicated: Secondary | ICD-10-CM

## 2021-12-09 NOTE — Telephone Encounter (Addendum)
Langley Gauss,  ?The scan was read as a LR 2, so they feel the right lung apex area of scarring is stable,  likely due to ?pleuroparenchymal scarring. Looks like her ascending thoracic aorta, was stable at  4.5 cm. We had referred her to thoracic surgery 3 months ago, and she was seen by Dr. Roxan Hockey, who measured the ascending thoracic aorta at 4.2-4.3 cm. He had ordered a CTA that was not done. ( The measurement difference is important in how often she needs to be followed with scans, so he was checking with CTA to get  better measurements) I have messaged him to see if he wants [place] to order the CTA in 3 months to re-evaluate. I will let you know what I hear from him. Thanks so much.  ? ?It is fine for you to call this one. I did a telephone note too. LR 2 , 12 month follow up. Stable scarring per radiology. We will see what Dr. Roxan Hockey says about a follow up CTA to measure aneurysm. Thanks so much. 12 month follow up and fax results to PCP.  ?

## 2021-12-09 NOTE — Telephone Encounter (Signed)
Contacted patient by phone to review results of recent LDCT.  Area of concern was noted as not suspicious for cancer and read as scarring of the lung, which was noted as stable.  Thoracic aorta was also noted as stable in size.  Informed our nurse practitioner has shared update with Dr. Roxan Hockey on aorta and if additional recommendations are made, someone will be in touch.  Patient acknowledged understanding and had no questions. Will plan for LDCT in 12 months.  Results faxed to PCP.   ?

## 2021-12-09 NOTE — Telephone Encounter (Deleted)
It is fine for you to call this one. I did a telephone note too. LR 2 , 12 month follow up. Stable scarring per radiology. We will see what Dr. Roxan Hockey says about a follow up CTA to measure aneurysm. Thanks so much. 12 month follow up and fax results to PCP.  ?

## 2021-12-10 ENCOUNTER — Other Ambulatory Visit: Payer: Self-pay | Admitting: Acute Care

## 2021-12-10 DIAGNOSIS — I719 Aortic aneurysm of unspecified site, without rupture: Secondary | ICD-10-CM

## 2021-12-15 ENCOUNTER — Other Ambulatory Visit: Payer: Self-pay | Admitting: Thoracic Surgery (Cardiothoracic Vascular Surgery)

## 2021-12-15 DIAGNOSIS — I7121 Aneurysm of the ascending aorta, without rupture: Secondary | ICD-10-CM

## 2022-01-18 ENCOUNTER — Encounter: Payer: Self-pay | Admitting: Pulmonary Disease

## 2022-01-18 ENCOUNTER — Ambulatory Visit: Payer: 59 | Admitting: Pulmonary Disease

## 2022-01-18 VITALS — BP 122/78 | HR 70 | Temp 97.8°F | Ht 67.5 in | Wt 175.2 lb

## 2022-01-18 DIAGNOSIS — F17210 Nicotine dependence, cigarettes, uncomplicated: Secondary | ICD-10-CM

## 2022-01-18 DIAGNOSIS — I719 Aortic aneurysm of unspecified site, without rupture: Secondary | ICD-10-CM

## 2022-01-18 DIAGNOSIS — J449 Chronic obstructive pulmonary disease, unspecified: Secondary | ICD-10-CM | POA: Diagnosis not present

## 2022-01-18 DIAGNOSIS — R9389 Abnormal findings on diagnostic imaging of other specified body structures: Secondary | ICD-10-CM | POA: Diagnosis not present

## 2022-01-18 NOTE — Patient Instructions (Signed)
We will going ahead and order some breathing test that will be done before you come back in 4 months.  We will help [place] establish baseline of your lung function.   We will try to get your CTs of the chest consolidated since you get regular CTs through Dr. Roxan Hockey.   We will see you in follow-up in 4 months time.  Call sooner if you have any new issues.

## 2022-01-18 NOTE — Progress Notes (Signed)
Subjective:    Patient ID: Carmen Ross, female    DOB: [birth year], 61 y.o.   MRN: [id number] Patient Care Team: Sherlene Shams, MD as PCP - General (Internal Medicine) Loreli Slot, MD as Consulting Physician (Cardiothoracic Surgery) Salena Saner, MD as Consulting Physician (Pulmonary Disease)  Chief Complaint  Patient presents with   Follow-up    Ct 11/29/21. Prod cough with clear sputum in the morning.     HPI Carmen Ross is a 61 year old current smoker with a 39-pack-year history of smoking history as noted below, who presents for follow up of a abnormality on low-dose cancer screening CT. The patient had a low-dose cancer screening CT on 01 September 2021.  This was read as a lung RADS 4B, suspicious.  It was noted that she had asymmetric ill-defined opacity in the right lung apex which was probably scarring but given the asymmetry and somewhat nodular character close follow-up was recommended.  A follow-up CT was done on 27 April.  The findings have now been noted to be consistent with pleural-parenchymal scarring and her CT now is lung RADS 2.  The patient also has been noted to have a 4.5 cm diameter ascending thoracic aortic aneurysm.The patient will need semiannual imaging with CT angio.  She is already being followed by Dr. Andrey Spearman.  Because of this she has been pulled from the lung cancer screening program as she will be getting imaging for her thoracic aneurysm on a semiannual basis.   With regards to the findings on her chest CT she has been entirely asymptomatic.  She has not had any weight loss or anorexia.  Lately she has noted some productive cough and mornings.  Cough is productive of clear sputum.  No hemoptysis.  No shortness of breath.  She does not note any other symptomatology.   As noted she is still smoking.  Smokes approximately half a pack of cigarettes per day.  She has started Chantix and has given herself a quit date.   Overall she feels well  and looks well.   Review of Systems A 10 point review of systems was performed and it is as noted above otherwise negative.  Patient Active Problem List   Diagnosis Date Noted   Ovarian cancer (HCC) 10/12/2021   Colon cancer screening 05/24/2021   Generalized anxiety disorder with panic attacks 06/10/2020   Essential hypertension 05/23/2020   Tobacco abuse counseling 07/05/2013   Tobacco abuse 07/05/2013   Visit for preventive health examination 03/28/2013   Social History   Tobacco Use   Smoking status: Every Day    Packs/day: 1.00    Years: 39.00    Additional pack years: 0.00    Total pack years: 39.00    Types: Cigarettes    Start date: 03/27/1982   Smokeless tobacco: Never   Tobacco comments:    Currently on Chantix--8 cigarettes daily--01/18/2022  Substance Use Topics   Alcohol use: Yes    Comment: occasional very rare glass of wine with holiday, 5-6 per year   Allergies  Allergen Reactions   Tape Rash   Current Meds  Medication Sig   ALPRAZolam (XANAX) 0.25 MG tablet Take 1 tablet (0.25 mg total) by mouth daily. As needed for panic attack   amLODipine (NORVASC) 2.5 MG tablet TAKE 1 TABLET BY MOUTH  DAILY   omeprazole (PRILOSEC) 40 MG capsule Take 1 capsule (40 mg total) by mouth daily as needed (heartburn). On an empty stomach   varenicline (CHANTIX)  1 MG tablet Take 1 tablet (1 mg total) by mouth 2 (two) times daily. (Patient taking differently: Take 1 mg by mouth daily.)   Immunization History  Administered Date(s) Administered   Influenza Split 05/12/2014, 05/08/2018   Influenza,inj,Quad PF,6+ Mos 05/24/2021   Influenza-Unspecified 05/13/2015, 05/06/2020, 05/14/2020   Moderna SARS-COV2 Booster Vaccination 05/28/2021   Moderna Sars-Covid-2 Vaccination 10/04/2019, 11/01/2019, 06/04/2020   Tdap 05/22/2020   Zoster Recombinat (Shingrix) 05/26/2022, 11/25/2022      Objective:   Physical Exam BP 122/78 (BP Location: Left Arm, Cuff Size: Normal)   Pulse 70    Temp 97.8 F (36.6 C) (Temporal)   Ht 5' 7.5" (1.715 m)   Wt 175 lb 3.2 oz (79.5 kg)   LMP 05/23/2015   SpO2 96%   BMI 27.04 kg/m   SpO2: 96 % O2 Device: None (Room air)  GENERAL: Well-developed, well-nourished woman, fit appearing, fully ambulatory. HEAD: Normocephalic, atraumatic.  EYES: Pupils equal, round, reactive to light.  No scleral icterus.  MOUTH: Dentition intact.  Oral mucosa moist.  No thrush. NECK: Supple. No thyromegaly. Trachea midline. No JVD.  No adenopathy. PULMONARY: Good air entry bilaterally.  No adventitious sounds. CARDIOVASCULAR: S1 and S2. Regular rate and rhythm.  No rubs, murmurs or gallops heard. ABDOMEN: Benign. MUSCULOSKELETAL: No joint deformity, no clubbing, no edema.  NEUROLOGIC: No overt focal deficit, no gait disturbance, speech is fluent. SKIN: Intact,warm,dry. PSYCH: Mood and behavior normal.       Assessment & Plan:     ICD-10-CM   1. COPD suggested by initial evaluation [facility])  J44.9 Pulmonary Function Test [facility] Only   Will proceed to obtain PFTs    2. Abnormal CT of the chest  R93.89    Process on the right upper lobe due to pleural parenchymal scarring    3. Aortic aneurysm without rupture, unspecified portion of aorta (HCC)  I71.9    4.5 cm Follows with Dr. Dorris Fetch, thoracic surgery Semiannual CT angio chest    4. Moderate smoker (20 or less per day)  F17.210    Patient has initiated cessation program     Orders Placed This Encounter  Procedures   Pulmonary Function Test [facility] Only    Standing Status:   Future    Number of Occurrences:   1    Standing Expiration Date:   01/19/2023    Scheduling Instructions:     Within 31mo    Order Specific Question:   Full PFT: includes the following: basic spirometry, spirometry pre & post bronchodilator, diffusion capacity (DLCO), lung volumes    Answer:   Full PFT   Will see the patient in follow-up in 4 months time she is to contact us prior to that time if any new difficulties  arise.  Gailen Shelter, MD Advanced Bronchoscopy PCCM [facility]    *This note was dictated using voice recognition software/Dragon.  Despite best efforts to proofread, errors can occur which can change the meaning. Any transcriptional errors that result from this process are unintentional and may not be fully corrected at the time of dictation.

## 2022-01-19 ENCOUNTER — Ambulatory Visit
Admission: RE | Admit: 2022-01-19 | Discharge: 2022-01-19 | Disposition: A | Discharge status: Home / Self Care | Payer: 59 | Source: Ambulatory Visit | Attending: Thoracic Surgery (Cardiothoracic Vascular Surgery) | Admitting: Thoracic Surgery (Cardiothoracic Vascular Surgery)

## 2022-01-19 DIAGNOSIS — I7121 Aneurysm of the ascending aorta, without rupture: Secondary | ICD-10-CM | POA: Insufficient documentation

## 2022-01-19 LAB — POCT I-STAT CREATININE: Creatinine, Ser: 0.8 mg/dL (ref 0.44–1.00)

## 2022-01-19 MED ORDER — IOHEXOL 300 MG/ML  SOLN: 100.0000 mL | Freq: Once | INTRAMUSCULAR | Status: DC | PRN

## 2022-01-19 MED ORDER — IOHEXOL 350 MG/ML SOLN
100.0000 mL | Freq: Once | INTRAVENOUS | Status: AC | PRN
  Administered 2022-01-19: 100 mL via INTRAVENOUS

## 2022-01-25 ENCOUNTER — Other Ambulatory Visit: Payer: 59

## 2022-01-25 ENCOUNTER — Encounter: Payer: Self-pay | Admitting: Thoracic Surgery (Cardiothoracic Vascular Surgery)

## 2022-01-25 ENCOUNTER — Ambulatory Visit: Payer: 59 | Admitting: Thoracic Surgery (Cardiothoracic Vascular Surgery)

## 2022-01-25 VITALS — BP 127/80 | HR 77 | Resp 20 | Ht 67.0 in | Wt 172.0 lb

## 2022-01-25 DIAGNOSIS — C569 Malignant neoplasm of unspecified ovary: Secondary | ICD-10-CM

## 2022-01-25 DIAGNOSIS — I7121 Aneurysm of the ascending aorta, without rupture: Secondary | ICD-10-CM

## 2022-01-25 NOTE — Progress Notes (Signed)
[address]       [place] [address]             [phone]     HPI: Carmen Ross returns for follow-up of her ascending aneurysm and opacity of right apex on CT.  Carmen Ross is a 61 year old woman with a history of tobacco abuse, hypertension, ovarian cancer, reflux, and a recently diagnosed ascending aneurysm.  She has smoked about a pack of cigarettes daily for 40 years.  She had a low-dose screening CT for lung cancer earlier this year.  It showed a RADS 4B lesion in the right upper lobe apex and also reported 4.5 cm ascending aneurysm.  I saw her in consultation in March and recommended a 51-monthfollow-up scan.  She now returns to discuss those results.  She has been feeling well.  No complaints of chest pain, pressure, tightness, or shortness of breath.  She is trying to quit smoking.  She is cut down dramatically but has not quit altogether yet.  Past Medical History:  Diagnosis Date   Anemia    heavy menstral pepriods 2 years ago, no periods for 14 months.    Chicken pox    Granulosa cell tumor of ovary, right    Headache    sinus related to weather changes   UTI (lower urinary tract infection)     Current Outpatient Medications  Medication Sig Dispense Refill   ALPRAZolam (XANAX) 0.25 MG tablet Take 1 tablet (0.25 mg total) by mouth daily. As needed for panic attack 20 tablet 3   amLODipine (NORVASC) 2.5 MG tablet TAKE 1 TABLET BY MOUTH  DAILY 90 tablet 3   omeprazole (PRILOSEC) 40 MG capsule Take 1 capsule (40 mg total) by mouth daily as needed (heartburn). On an empty stomach 90 capsule 2   varenicline (CHANTIX) 1 MG tablet Take 1 tablet (1 mg total) by mouth 2 (two) times daily. (Patient taking differently: Take 1 mg by mouth daily.) 60 tablet 5   No current facility-administered medications for this visit.    Physical Exam BP 127/80   Pulse 77   Resp 20   Ht '5\' 7"'$  (1.702 m)   Wt 172 lb (78 kg)   LMP 05/23/2015   SpO2 97% Comment: RA   BMI 26.927kg/m  61year old woman in no acute distress Well-developed and well-nourished Alert and oriented x3 with no focal deficits Lungs clear bilaterally Cardiac regular rate and rhythm with normal S1 and S2 and no rubs or murmurs  Diagnostic Tests: CT ANGIOGRAPHY CHEST WITH CONTRAST   TECHNIQUE: Multidetector CT imaging of the chest was performed using the standard protocol during bolus administration of intravenous contrast. Multiplanar CT image reconstructions and MIPs were obtained to evaluate the vascular anatomy.   RADIATION DOSE REDUCTION: This exam was performed according to the departmental dose-optimization program which includes automated exposure control, adjustment of the mA and/or kV according to patient size and/or use of iterative reconstruction technique.   CONTRAST:  1096mOMNIPAQUE IOHEXOL 350 MG/ML SOLN   COMPARISON:  12/02/2021, 09/01/2021   FINDINGS: Cardiovascular: Preferential opacification of the thoracic aorta. Unchanged enlargement of the tubular ascending thoracic aorta, measuring up to 4.2 x 4.0 cm. This has been previously reported as measuring up to 4.5 cm, however current measurements are unchanged when compared to prior examinations measured with similar orthogonal technique. Sinuses of Valsalva measure up to 4.1 cm. Aorta valve measures up to 2.9 cm. The distal aortic arch is  enlarged measuring up to 3.1 x 3.1 cm, unchanged. Normal caliber of the descending thoracic aorta distally measuring up to 2.0 x 2.1 cm. Mild, scattered aortic atherosclerosis. Cardiomegaly. No pericardial effusion.   Mediastinum/Nodes: No enlarged mediastinal, hilar, or axillary lymph nodes. Thyroid gland, trachea, and esophagus demonstrate no significant findings.   Lungs/Pleura: Mild paraseptal emphysema. Biapical pleuroparenchymal scarring. Background of fine centrilobular nodularity, most concentrated in the lung apices. No pleural effusion  or pneumothorax.   Upper Abdomen: No acute abnormality.   Musculoskeletal: No chest wall abnormality. No acute osseous findings.   Review of the MIP images confirms the above findings.   IMPRESSION: 1. Unchanged enlargement of the tubular ascending thoracic aorta, measuring up to 4.2 x 4.0 cm. This has been previously reported as measuring up to 4.5 cm, however current measurements are unchanged when compared to prior examinations measured with similar orthogonal technique. The distal aortic arch is enlarged measuring up to 3.1 x 3.1 cm, unchanged. Mild, scattered aortic atherosclerosis. Recommend annual imaging followup by CTA or MRA. This recommendation follows 2010 ACCF/AHA/AATS/ACR/ASA/SCA/SCAI/SIR/STS/SVM Guidelines for the Diagnosis and Management of Patients with Thoracic Aortic Disease. Circulation. 2010; 121: F093-A355. Aortic aneurysm NOS (ICD10-I71.9) 2. Emphysema. 3. Background of fine centrilobular nodularity, most concentrated in the lung apices, consistent with smoking-related respiratory bronchiolitis. 4. Unchanged, benign biapical pleuroparenchymal scarring.   Aortic Atherosclerosis (ICD10-I70.0) and Emphysema (ICD10-J43.9).     Electronically Signed   By: Delanna Ahmadi M.D.   On: 01/19/2022 14:48 I personally reviewed the CT images.  No change in the apical scarring or centrilobular nodularity.  Ascending aneurysm 4.1 cm.  Impression: Carmen Ross is a 61 year old woman with a history of tobacco abuse, hypertension, ovarian cancer, reflux, and ascending aortic aneurysm.    Ascending aneurysm-measures about 4.1 cm.  No indication for intervention.  Needs annual follow-up.  Importance of blood pressure control emphasized.  Tobacco abuse-she is making an earnest effort to quit smoking.  She is currently on Chantix and smoking less than half pack of cigarettes daily.  Right apical opacity/nodularity-stable.  Likely scarring.  Possible component of  smoking-related bronchiolitis.  Plan: Return in 1 year with CT angio chest  Carmen Nakayama, MD [facility] [phone]

## 2022-03-01 ENCOUNTER — Telehealth: Payer: Self-pay | Admitting: Acute Care

## 2022-03-01 NOTE — Telephone Encounter (Signed)
Please advise on note below.   

## 2022-03-01 NOTE — Telephone Encounter (Signed)
Lm for patient to make her aware of below message.

## 2022-03-02 NOTE — Telephone Encounter (Signed)
Patient is aware of below message and voiced her understanding.  Nothing further needed.   

## 2022-03-24 ENCOUNTER — Encounter: Payer: Self-pay | Admitting: Internal Medicine

## 2022-03-24 DIAGNOSIS — B9681 Helicobacter pylori [H. pylori] as the cause of diseases classified elsewhere: Secondary | ICD-10-CM

## 2022-03-24 MED ORDER — OMEPRAZOLE 40 MG PO CPDR: 40.0000 mg | DELAYED_RELEASE_CAPSULE | Freq: Every day | ORAL | 3 refills | Status: DC | PRN

## 2022-03-24 MED ORDER — VARENICLINE TARTRATE 1 MG PO TABS: 1.0000 mg | ORAL_TABLET | Freq: Two times a day (BID) | ORAL | 0 refills | Status: DC

## 2022-05-17 ENCOUNTER — Ambulatory Visit: Payer: 59 | Attending: Pulmonary Disease

## 2022-05-17 DIAGNOSIS — J449 Chronic obstructive pulmonary disease, unspecified: Secondary | ICD-10-CM | POA: Insufficient documentation

## 2022-05-17 DIAGNOSIS — F17210 Nicotine dependence, cigarettes, uncomplicated: Secondary | ICD-10-CM | POA: Diagnosis not present

## 2022-05-17 LAB — PULMONARY FUNCTION TEST ARMC ONLY
DL/VA % pred: 87 %
DL/VA: 3.58 ml/min/mmHg/L
DLCO unc % pred: 80 %
DLCO unc: 18.14 ml/min/mmHg
FEF 25-75 Post: 1.49 L/sec
FEF 25-75 Pre: 1.53 L/sec
FEF2575-%Change-Post: -2 %
FEF2575-%Pred-Post: 59 %
FEF2575-%Pred-Pre: 60 %
FEV1-%Change-Post: 0 %
FEV1-%Pred-Post: 79 %
FEV1-%Pred-Pre: 79 %
FEV1-Post: 2.28 L
FEV1-Pre: 2.28 L
FEV1FVC-%Change-Post: 2 %
FEV1FVC-%Pred-Pre: 92 %
FEV6-%Change-Post: -3 %
FEV6-%Pred-Post: 85 %
FEV6-%Pred-Pre: 88 %
FEV6-Post: 3.05 L
FEV6-Pre: 3.16 L
FEV6FVC-%Change-Post: 0 %
FEV6FVC-%Pred-Post: 102 %
FEV6FVC-%Pred-Pre: 103 %
FVC-%Change-Post: -2 %
FVC-%Pred-Post: 82 %
FVC-%Pred-Pre: 85 %
FVC-Post: 3.08 L
FVC-Pre: 3.17 L
Post FEV1/FVC ratio: 74 %
Post FEV6/FVC ratio: 99 %
Pre FEV1/FVC ratio: 72 %
Pre FEV6/FVC Ratio: 100 %
RV % pred: 99 %
RV: 2.16 L
TLC % pred: 94 %
TLC: 5.29 L

## 2022-05-17 MED ORDER — ALBUTEROL SULFATE (2.5 MG/3ML) 0.083% IN NEBU
2.5000 mg | INHALATION_SOLUTION | Freq: Once | RESPIRATORY_TRACT | Status: AC
  Administered 2022-05-17: 2.5 mg via RESPIRATORY_TRACT

## 2022-05-24 ENCOUNTER — Ambulatory Visit: Payer: 59 | Admitting: Pulmonary Disease

## 2022-05-24 ENCOUNTER — Encounter: Payer: Self-pay | Admitting: Pulmonary Disease

## 2022-05-24 VITALS — BP 118/82 | HR 72 | Temp 98.1°F | Ht 67.0 in | Wt 175.6 lb

## 2022-05-24 DIAGNOSIS — Z23 Encounter for immunization: Secondary | ICD-10-CM

## 2022-05-24 DIAGNOSIS — J449 Chronic obstructive pulmonary disease, unspecified: Secondary | ICD-10-CM | POA: Diagnosis not present

## 2022-05-24 DIAGNOSIS — F17210 Nicotine dependence, cigarettes, uncomplicated: Secondary | ICD-10-CM | POA: Diagnosis not present

## 2022-05-24 DIAGNOSIS — I719 Aortic aneurysm of unspecified site, without rupture: Secondary | ICD-10-CM

## 2022-05-24 DIAGNOSIS — J84115 Respiratory bronchiolitis interstitial lung disease: Secondary | ICD-10-CM | POA: Diagnosis not present

## 2022-05-24 NOTE — Patient Instructions (Signed)
You received your flu vaccine today.  Continue working on quitting smoking, you are doing a great job.  We will see you in follow-up in 6 months time.  Call sooner should any new problems arise.

## 2022-05-24 NOTE — Progress Notes (Signed)
Subjective:    Patient ID: Carmen Ross, female    DOB: April 17, 1961, 61 y.o.   MRN: [id number] Patient Care Team: Sherlene Shams, MD as PCP - General (Internal Medicine) Loreli Slot, MD as Consulting Physician (Cardiothoracic Surgery)  Chief Complaint  Patient presents with   Follow-up    No SOB or wheezing. Cough from allergies.    HPI Patient is a 61 year old current smoker with a 39-pack-year history of smoking  and a history as noted below, who presents for follow-up of a abnormality on low-dose cancer screening CT. the patient was initially evaluated here on 12 November 2021 most recent visit was on 18 January 2022.  The patient had a low-dose cancer screening CT on 01 September 2021.  This was read as a lung RADS 4B, suspicious.  It was noted that she had asymmetric ill-defined opacity in the right lung apex which was probably scarring but given the asymmetry and somewhat nodular character close follow-up was recommended.  A follow-up CT in 3 months was recommended and the patient had this study done on 02 December 2021 and this time her CT was read as lung RADS 2 and the issues were noted to be likely related to pleural-parenchymal scarring.  In addition this study confirmed a 4.5 cm diameter ascending thoracic aortic aneurysm.  The patient will need semiannual imaging with CT angio.  She is already being followed by Dr. Andrey Spearman.  Subsequently she underwent follow-up CT angio chest on 19 January 2022 again confirming that the findings in the lung were related to pleural-parenchymal scarring.   With regards to the findings on her chest CT she has been entirely asymptomatic.  She has not had any weight loss or anorexia.  No cough or sputum production usually, notes some related to postnasal drip from allergies.  No shortness of breath.  She does not note any other symptomatology.   As noted she is still smoking ever she is actively engaged in smoking cessation and has decreased amount  of cigarettes smoked per day to 5-6 daily.  She had pulmonary function testing on 17 May 2022 that showed an FEV1 of 2.28 L or 79% predicted, FVC of 3.17 L or 85% predicted, FEV1/FVC of 72%, lung volumes were normal, diffusion capacity normal.  Consistent with minimal obstructive disease.    Review of Systems A 10 point review of systems was performed and it is as noted above otherwise negative.  Patient Active Problem List   Diagnosis Date Noted   Ovarian cancer (HCC) 10/12/2021   Colon cancer screening 05/24/2021   Generalized anxiety disorder with panic attacks 06/10/2020   Essential hypertension 05/23/2020   Helicobacter pylori gastritis 03/15/2016   Tobacco abuse counseling 07/05/2013   Tobacco abuse 07/05/2013   Routine general medical examination at a health care facility 03/28/2013   Social History   Tobacco Use   Smoking status: Every Day    Packs/day: 1.00    Years: 39.00    Total pack years: 39.00    Types: Cigarettes    Start date: 03/27/1982   Smokeless tobacco: Never   Tobacco comments:    Currently on Chantix--8 cigarettes daily--01/18/2022    5-6 cigarettes a day 05/24/2022  Substance Use Topics   Alcohol use: Yes    Comment: occasional very rare glass of wine with holiday, 5-6 per year   Allergies  Allergen Reactions   Tape Rash   Current Meds  Medication Sig   ALPRAZolam (XANAX) 0.25 MG  tablet Take 1 tablet (0.25 mg total) by mouth daily. As needed for panic attack   amLODipine (NORVASC) 2.5 MG tablet TAKE 1 TABLET BY MOUTH  DAILY   omeprazole (PRILOSEC) 40 MG capsule Take 1 capsule (40 mg total) by mouth daily as needed (heartburn). On an empty stomach   varenicline (CHANTIX) 1 MG tablet Take 1 tablet (1 mg total) by mouth 2 (two) times daily.   Immunization History  Administered Date(s) Administered   Influenza Split 05/12/2014, 05/08/2018   Influenza,inj,Quad PF,6+ Mos 05/24/2021   Influenza-Unspecified 05/13/2015, 05/06/2020, 05/14/2020    Moderna SARS-COV2 Booster Vaccination 05/28/2021   Moderna Sars-Covid-2 Vaccination 10/04/2019, 11/01/2019, 06/04/2020   Tdap 05/22/2020       Objective:   Physical Exam BP 118/82 (BP Location: Left Arm, Cuff Size: Normal)   Pulse 72   Temp 98.1 F (36.7 C)   Ht 5\' 7"  (1.702 m)   Wt 175 lb 9.6 oz (79.7 kg)   LMP 05/23/2015   SpO2 98%   BMI 27.50 kg/m  GENERAL: HEAD: Normocephalic, atraumatic.  EYES: Pupils equal, round, reactive to light.  No scleral icterus.  MOUTH:  NECK: Supple. No thyromegaly. Trachea midline. No JVD.  No adenopathy. PULMONARY: Good air entry bilaterally.  No adventitious sounds. CARDIOVASCULAR: S1 and S2. Regular rate and rhythm.  ABDOMEN: MUSCULOSKELETAL: No joint deformity, no clubbing, no edema.  NEUROLOGIC:  SKIN: Intact,warm,dry. PSYCH:  Representative image from CT angio chest performed 19 January 2022 showing pleuro-parenchymal scarring at lung apices:      Patient also has nodularity consistent with respiratory bronchiolitis (smokers bronchiolitis).     Assessment & Plan:     ICD-10-CM   1. Stage 1 mild COPD by GOLD classification (HCC)  J44.9    Patient asymptomatic Withholding inhalers for now    2. Aortic aneurysm without rupture, unspecified portion of aorta (HCC)  I71.9    Follows closely with thoracic surgery Gets periodic CTs of the chest    3. 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 Tobacco dependence due to cigarettes  F17.210    Patient currently working on quitting smoking Not on lung cancer screening program due to frequent CTs    5. Need for immunization against influenza  Z23 Flu Vaccine QUAD 89mo+IM (Fluarix, Fluzone & Alfiuria Quad PF)   Proceed with influenza immunization today     Orders Placed This Encounter  Procedures   Flu Vaccine QUAD 85mo+IM (Fluarix, Fluzone & Alfiuria Quad PF)    Patient that since she is already quiring frequent CTs for monitoring of her aortic aneurysm these can be used and lieu of the lung cancer screening CTs.  She will be withdrawn from the lung cancer screening program as this will be duplication of imaging.  Will see the patient in 6 months time she is to call sooner should any new problems arise.  Gailen Shelter, MD Advanced Bronchoscopy PCCM [facility]    *This note was dictated using voice recognition software/Dragon.  Despite best efforts to proofread, errors can occur which can change the meaning. Any transcriptional errors that result from this process are unintentional and may not be fully corrected at the time of dictation.

## 2022-05-26 ENCOUNTER — Other Ambulatory Visit (HOSPITAL_COMMUNITY)
Admission: RE | Admit: 2022-05-26 | Discharge: 2022-05-26 | Disposition: A | Discharge status: Home / Self Care | Payer: 59 | Source: Ambulatory Visit | Attending: Internal Medicine | Admitting: Internal Medicine

## 2022-05-26 ENCOUNTER — Encounter: Payer: Self-pay | Admitting: Internal Medicine

## 2022-05-26 ENCOUNTER — Ambulatory Visit (INDEPENDENT_AMBULATORY_CARE_PROVIDER_SITE_OTHER): Payer: 59

## 2022-05-26 ENCOUNTER — Ambulatory Visit (INDEPENDENT_AMBULATORY_CARE_PROVIDER_SITE_OTHER): Payer: 59 | Admitting: Internal Medicine

## 2022-05-26 VITALS — BP 126/82 | HR 75 | Temp 98.9°F | Ht 67.0 in | Wt 177.0 lb

## 2022-05-26 DIAGNOSIS — M79671 Pain in right foot: Secondary | ICD-10-CM | POA: Diagnosis not present

## 2022-05-26 DIAGNOSIS — Z23 Encounter for immunization: Secondary | ICD-10-CM

## 2022-05-26 DIAGNOSIS — I7121 Aneurysm of the ascending aorta, without rupture: Secondary | ICD-10-CM

## 2022-05-26 DIAGNOSIS — Z124 Encounter for screening for malignant neoplasm of cervix: Secondary | ICD-10-CM | POA: Diagnosis not present

## 2022-05-26 DIAGNOSIS — C569 Malignant neoplasm of unspecified ovary: Secondary | ICD-10-CM

## 2022-05-26 DIAGNOSIS — R490 Dysphonia: Secondary | ICD-10-CM

## 2022-05-26 DIAGNOSIS — Z720 Tobacco use: Secondary | ICD-10-CM | POA: Diagnosis not present

## 2022-05-26 DIAGNOSIS — R5383 Other fatigue: Secondary | ICD-10-CM

## 2022-05-26 DIAGNOSIS — I10 Essential (primary) hypertension: Secondary | ICD-10-CM | POA: Diagnosis not present

## 2022-05-26 DIAGNOSIS — Z0000 Encounter for general adult medical examination without abnormal findings: Secondary | ICD-10-CM | POA: Diagnosis not present

## 2022-05-26 DIAGNOSIS — I719 Aortic aneurysm of unspecified site, without rupture: Secondary | ICD-10-CM | POA: Insufficient documentation

## 2022-05-26 DIAGNOSIS — Z1231 Encounter for screening mammogram for malignant neoplasm of breast: Secondary | ICD-10-CM

## 2022-05-26 NOTE — Assessment & Plan Note (Addendum)
S/p bilateral oophorectomy 2017.

## 2022-05-26 NOTE — Assessment & Plan Note (Signed)

## 2022-05-26 NOTE — Assessment & Plan Note (Signed)
Found incidentally on CT chest for lung CA screening

## 2022-05-26 NOTE — Progress Notes (Signed)
The patient is here for annual preventive examination and management of other chronic and acute problems.   The risk factors are reflected in the social history.   The roster of all physicians providing medical care to patient - is listed in the Snapshot section of the chart.   Activities of daily living:  The patient is 100% independent in all ADLs: dressing, toileting, feeding as well as independent mobility   Home safety : The patient has smoke detectors in the home. They wear seatbelts.  There are no unsecured firearms at home. There is no violence in the home.    There is no risks for hepatitis, STDs or HIV. There is no   history of blood transfusion. They have no travel history to infectious disease endemic areas of the world.   The patient has seen their dentist in the last six month. They have seen their eye doctor in the last year. The patinet  denies slight hearing difficulty with regard to whispered voices and some television programs.  They have deferred audiologic testing in the last year.  They do not  have excessive sun exposure. Discussed the need for sun protection: hats, long sleeves and use of sunscreen if there is significant sun exposure.    Diet: the importance of a healthy diet is discussed. They do have a healthy diet.   The benefits of regular aerobic exercise were discussed. The patient  exercises  3 to 5 days per week  for  60 minutes.    Depression screen: there are no signs or vegative symptoms of depression- irritability, change in appetite, anhedonia, sadness/tearfullness.   The following portions of the patient's history were reviewed and updated as appropriate: allergies, current medications, past family history, past medical history,  past surgical history, past social history  and problem list.   Visual acuity was not assessed per patient preference since the patient has regular follow up with an  ophthalmologist. Hearing and body mass index were assessed and  reviewed.    During the course of the visit the patient was educated and counseled about appropriate screening and preventive services including : fall prevention , diabetes screening, nutrition counseling, colorectal cancer screening, and recommended immunizations.    Chief Complaint:   1) using chantix for tobacco cessation,   down to 5 cgs daily .  Has been walking daily,  notes increased urination  2) s/p  BSO for ovarian neoplams. .  Due for PAP smear  reviewed last  sd last PAP 2020 cervical component was sampled.   3) HTN:  home readings are ALL < 130/80 on amlodipine  4) hoarseness of voice .  Says it occurs every fall .   Review of Symptoms  Patient denies headache, fevers, malaise, unintentional weight loss, skin rash, eye pain, sinus congestion and sinus pain, sore throat, dysphagia,  hemoptysis , cough, dyspnea, wheezing, chest pain, palpitations, orthopnea, edema, abdominal pain, nausea, melena, diarrhea, constipation, flank pain, dysuria, hematuria, urinary  Frequency, nocturia, numbness, tingling, seizures,  Focal weakness, Loss of consciousness,  Tremor, insomnia, depression, anxiety, and suicidal ideation.    Physical Exam:  BP 126/82 (BP Location: Left Arm, Patient Position: Sitting, Cuff Size: Normal)   Pulse 75   Temp 98.9 F (37.2 C) (Oral)   Ht '5\' 7"'$  (1.702 m)   Wt 177 lb (80.3 kg)   LMP 05/23/2015   SpO2 97%   BMI 27.72 kg/m    General Appearance:    Alert, cooperative, no distress, appears  stated age  Head:    Normocephalic, without obvious abnormality, atraumatic  Eyes:    PERRL, conjunctiva/corneas clear, EOM's intact, fundi    benign, both eyes  Ears:    Normal TM's and external ear canals, both ears  Nose:   Nares normal, septum midline, mucosa normal, no drainage    or sinus tenderness  Throat:   Lips, mucosa, and tongue normal; teeth and gums normal  Neck:   Supple, symmetrical, trachea midline, no adenopathy;    thyroid:  no  enlargement/tenderness/nodules; no carotid   bruit or JVD  Back:     Symmetric, no curvature, ROM normal, no CVA tenderness  Lungs:     Clear to auscultation bilaterally, respirations unlabored  Chest Wall:    No tenderness or deformity   Heart:    Regular rate and rhythm, S1 and S2 normal, no murmur, rub   or gallop  Breast Exam:    No tenderness, masses, or nipple abnormality  Abdomen:     Soft, non-tender, bowel sounds active all four quadrants,    no masses, no organomegaly  Genitalia:    Pelvic: cervix normal in appearance, external genitalia normal, no adnexal masses or tenderness, no cervical motion tenderness, rectovaginal septum normal, uterus normal size, shape, and consistency and vagina normal without discharge  Extremities:   Extremities normal, atraumatic, no cyanosis or edema  Pulses:   2+ and symmetric all extremities  Skin:   Skin color, texture, turgor normal, no rashes or lesions  Lymph nodes:   Cervical, supraclavicular, and axillary nodes normal  Neurologic:   CNII-XII intact, normal strength, sensation and reflexes    throughout     Assessment and Plan:  Aortic aneurysm [facility]) Found incidentally on CT chest for lung CA screening   Ovarian cancer [facility]) S/p bilateral oophorectomy 2017.   Visit for preventive health examination age appropriate education and counseling updated, referrals for preventative services and immunizations addressed, dietary and smoking counseling addressed, most recent labs reviewed.  I have personally reviewed and have noted:   1) the patient's medical and social history 2) The pt's use of alcohol, tobacco, and illicit drugs 3) The patient's current medications and supplements 4) Functional ability including ADL's, fall risk, home safety risk, hearing and visual impairment 5) Diet and physical activities 6) Evidence for depression or mood disorder 7) The patient's height, weight, and BMI have been recorded in the chartI have made  referrals, and provided counseling and education based on review of the above  Essential hypertension Excellent control with 2.5 mg amlodipine based on home readings.  No  Change unless home readings are > 120/70   Updated Medication List Outpatient Encounter Medications as of 05/26/2022  Medication Sig   ALPRAZolam (XANAX) 0.25 MG tablet Take 1 tablet (0.25 mg total) by mouth daily. As needed for panic attack   amLODipine (NORVASC) 2.5 MG tablet TAKE 1 TABLET BY MOUTH  DAILY   omeprazole (PRILOSEC) 40 MG capsule Take 1 capsule (40 mg total) by mouth daily as needed (heartburn). On an empty stomach   varenicline (CHANTIX) 1 MG tablet Take 1 tablet (1 mg total) by mouth 2 (two) times daily.   No facility-administered encounter medications on file as of 05/26/2022.

## 2022-05-26 NOTE — Assessment & Plan Note (Signed)
Excellent control with 2.5 mg amlodipine based on home readings.  No  Change unless home readings are > 120/70

## 2022-05-26 NOTE — Patient Instructions (Addendum)
YOU HAVE A CERVIX !  PAP SMEAR WAS DONE TODAY  [facility] REFERRAL IN PROCESS FOR EVALUATION OF LARYNX (VOCAL CORDS)  Your annual mammogram has been ordered AND IS DUE in LATE DECEMBER.  [facility] will not allow [place] to schedule it for you,  so please  call to make your appointment [phone]    [place] [address] [place]

## 2022-05-27 ENCOUNTER — Encounter: Payer: Self-pay | Admitting: Internal Medicine

## 2022-05-27 LAB — CYTOLOGY - PAP
Adequacy: ABSENT
Comment: NEGATIVE
Diagnosis: NEGATIVE
High risk HPV: NEGATIVE

## 2022-05-27 LAB — MICROALBUMIN / CREATININE URINE RATIO
Creatinine, Urine: 66.2 mg/dL
Microalb/Creat Ratio: 5 mg/g creat (ref 0–29)
Microalbumin, Urine: 3.4 ug/mL

## 2022-05-28 ENCOUNTER — Other Ambulatory Visit: Payer: Self-pay | Admitting: Internal Medicine

## 2022-05-28 MED ORDER — ALPRAZOLAM 0.25 MG PO TABS: 0.2500 mg | ORAL_TABLET | Freq: Every day | ORAL | 5 refills | Status: DC

## 2022-06-22 ENCOUNTER — Ambulatory Visit: Payer: 59

## 2022-06-22 ENCOUNTER — Ambulatory Visit: Payer: 59 | Admitting: Podiatry

## 2022-06-22 ENCOUNTER — Encounter: Payer: Self-pay | Admitting: Podiatry

## 2022-06-22 DIAGNOSIS — M722 Plantar fascial fibromatosis: Secondary | ICD-10-CM | POA: Diagnosis not present

## 2022-06-22 DIAGNOSIS — N83292 Other ovarian cyst, left side: Secondary | ICD-10-CM | POA: Insufficient documentation

## 2022-06-22 MED ORDER — TRIAMCINOLONE ACETONIDE 40 MG/ML IJ SUSP
20.0000 mg | Freq: Once | INTRAMUSCULAR | Status: AC
  Administered 2022-06-22: 20 mg

## 2022-06-22 MED ORDER — MELOXICAM 15 MG PO TABS: 15.0000 mg | ORAL_TABLET | Freq: Every day | ORAL | 3 refills | Status: DC

## 2022-06-22 MED ORDER — METHYLPREDNISOLONE 4 MG PO TBPK: ORAL_TABLET | ORAL | 0 refills | Status: DC

## 2022-06-22 NOTE — Patient Instructions (Signed)

## 2022-06-22 NOTE — Progress Notes (Signed)
Subjective:  Patient ID: Carmen Ross, female    DOB: 1960-10-18,  MRN: [id number] HPI Chief Complaint  Patient presents with   Foot Pain    Plantar heel right - aching x 3 months, AM pain, its bad at the end of the day after on it all day too, tried OTC NSAIDS-no help, PCP xrayed - said she didn't think it was PF (xrays in [facility] 05/28/22)   New Patient (Initial Visit)    61 y.o. female presents with the above complaint.   ROS: Denies fever chills nausea vomit muscle aches pains calf pain back pain chest pain shortness of breath.  Past Medical History:  Diagnosis Date   Anemia    heavy menstral pepriods 2 years ago, no periods for 14 months.    Chicken pox    Granulosa cell tumor of ovary, right    Headache    sinus related to weather changes   Helicobacter pylori gastritis 03/15/2016       UTI (lower urinary tract infection)    Past Surgical History:  Procedure Laterality Date   [place] SALPINGO OOPHERECTOMY Bilateral 08/05/2016   Procedure: LAPAROSCOPIC BILATERAL SALPINGO OOPHORECTOMY;  Surgeon: Boykin Nearing, MD;  Location: [facility] ORS;  Service: Gynecology;  Laterality: Bilateral;   LAPAROSCOPIC LYSIS OF ADHESIONS  08/05/2016   Procedure: LAPAROSCOPIC LYSIS OF ADHESIONS;  Surgeon: Gwen Her Schermerhorn, MD;  Location: [facility] ORS;  Service: Gynecology;;   OOPHORECTOMY Bilateral 07/2016    Current Outpatient Medications:    ALLERGY RELIEF 180 MG tablet, Take 180 mg by mouth daily., Disp: , Rfl:    meloxicam (MOBIC) 15 MG tablet, Take 1 tablet (15 mg total) by mouth daily., Disp: 30 tablet, Rfl: 3   methylPREDNISolone (MEDROL DOSEPAK) 4 MG TBPK tablet, 6 day dose pack - take as directed, Disp: 21 tablet, Rfl: 0   ALPRAZolam (XANAX) 0.25 MG tablet, Take 1 tablet (0.25 mg total) by mouth daily. As needed for panic attack, Disp: 20 tablet, Rfl: 5   amLODipine (NORVASC) 2.5 MG tablet, TAKE 1 TABLET BY MOUTH   DAILY, Disp: 90 tablet, Rfl: 3   omeprazole (PRILOSEC) 40 MG capsule, Take 1 capsule (40 mg total) by mouth daily as needed (heartburn). On an empty stomach, Disp: 90 capsule, Rfl: 3   varenicline (CHANTIX) 1 MG tablet, Take 1 tablet (1 mg total) by mouth 2 (two) times daily., Disp: 180 tablet, Rfl: 0  Allergies  Allergen Reactions   Tape Rash   Review of Systems Objective:  There were no vitals filed for this visit.  General: Well developed, nourished, in no acute distress, alert and oriented x3   Dermatological: Skin is warm, dry and supple bilateral. Nails x 10 are well maintained; remaining integument appears unremarkable at this time. There are no open sores, no preulcerative lesions, no rash or signs of infection present.  Vascular: Dorsalis Pedis artery and Posterior Tibial artery pedal pulses are 2/4 bilateral with immedate capillary fill time. Pedal hair growth present. No varicosities and no lower extremity edema present bilateral.   Neruologic: Grossly intact via light touch bilateral. Vibratory intact via tuning fork bilateral. Protective threshold with Semmes Wienstein monofilament intact to all pedal sites bilateral. Patellar and Achilles deep tendon reflexes 2+ bilateral. No Babinski or clonus noted bilateral.   Musculoskeletal: No gross boney pedal deformities bilateral. No pain, crepitus, or limitation noted with foot and ankle range of motion bilateral. Muscular strength  5/5 in all groups tested bilateral.  She pain on palpation medial calcaneal tubercle of the right heel some tenderness on frontal plane range of motion particularly in the fourth fifth tarsometatarsal joint.  Mild tenderness on the plantar aspect of the calcaneus and lateral calcaneus as well.  Gait: Unassisted, Nonantalgic.    Radiographs:  Radiographs were reviewed previously taken by PCP demonstrate an osseously mature individual 3 views AP medial oblique and lateral demonstrating a very small plantar  distally oriented calcaneal heel spur with soft tissue increase in density plantar fascial Caney insertion site otherwise there are no acute osseous abnormalities.  Assessment & Plan:   Assessment: Planter fasciitis right  Plan: Discussed etiology pathology conservative or surgical therapies I injected the medial aspect 20 mg Kenalog 5 mg Marcaine to the point of maximal tenderness at the plantar fascia calcaneal insertion site.  Tolerated procedure well without palpable stations.  Started her on methylprednisolone to be followed by meloxicam and a plantar fascia brace.  Discussed appropriate shoe gear stretching exercise ice therapy and shoe gear modifications.     Wyoma Genson T. [place], [place]

## 2022-06-23 ENCOUNTER — Other Ambulatory Visit: Payer: Self-pay | Admitting: Internal Medicine

## 2022-07-07 ENCOUNTER — Other Ambulatory Visit: Payer: Self-pay | Admitting: Internal Medicine

## 2022-07-13 ENCOUNTER — Ambulatory Visit
Admission: RE | Admit: 2022-07-13 | Discharge: 2022-07-13 | Disposition: A | Discharge status: Home / Self Care | Payer: 59 | Source: Ambulatory Visit | Attending: Internal Medicine | Admitting: Internal Medicine

## 2022-07-13 DIAGNOSIS — Z1231 Encounter for screening mammogram for malignant neoplasm of breast: Secondary | ICD-10-CM | POA: Diagnosis not present

## 2022-07-25 ENCOUNTER — Encounter: Payer: Self-pay | Admitting: Podiatry

## 2022-07-25 ENCOUNTER — Ambulatory Visit: Payer: 59 | Admitting: Podiatry

## 2022-07-25 DIAGNOSIS — M722 Plantar fascial fibromatosis: Secondary | ICD-10-CM | POA: Diagnosis not present

## 2022-07-25 NOTE — Progress Notes (Signed)
She presents today for follow-up of her plantar fasciitis.  She states that is doing so much better she feels that she is at least 90+ percent better.  She has purchased a new pair of Brooks adrenaline tennis shoes and states that this has helped considerably.  She continues her meloxicam and her Planter fascial brace.  Objective: Vital signs stable alert and oriented x 3.  Pulses are palpable.  There is no erythema edema salines drainage or odor she has no tenderness on palpation medial calcaneal tubercle of the right heel.  No open lesions or wounds are noted.  Assessment: Resolving Planter fasciitis right foot.  Plan: Continue all conservative therapies for at least 1 more month.  Notify us with any regression.

## 2022-09-26 ENCOUNTER — Telehealth: Payer: 59 | Admitting: Internal Medicine

## 2022-09-26 ENCOUNTER — Encounter: Payer: Self-pay | Admitting: Internal Medicine

## 2022-09-26 VITALS — Temp 99.7°F | Ht 67.0 in | Wt 177.0 lb

## 2022-09-26 DIAGNOSIS — U071 COVID-19: Secondary | ICD-10-CM

## 2022-09-26 HISTORY — DX: COVID-19: U07.1

## 2022-09-26 MED ORDER — CHERATUSSIN AC 100-10 MG/5ML PO SOLN: 5.0000 mL | Freq: Three times a day (TID) | ORAL | 0 refills | Status: DC | PRN

## 2022-09-26 MED ORDER — PROMETHAZINE HCL 12.5 MG PO TABS: 12.5000 mg | ORAL_TABLET | Freq: Four times a day (QID) | ORAL | 0 refills | Status: DC | PRN

## 2022-09-26 MED ORDER — PREDNISONE 10 MG PO TABS: ORAL_TABLET | ORAL | 0 refills | Status: DC

## 2022-09-26 NOTE — Assessment & Plan Note (Addendum)
Mild, with no complications.  She is not short of breath or febrile  no sinus pain.  Will rx  Phenergan prednisone cheratussin benadryl and afrin

## 2022-09-26 NOTE — Patient Instructions (Addendum)
Covid care:  Afrin nasal spray every 12 hours for maximum  5 days as needed  for congestion   Benadryl for the drippy nose,  can be combined with Delsym for cough if Cheratussin Is not in stock at pharmacy  Promethazine (generic for phenergan )  every 6-8 hours as needed for nausea.  SEDATING

## 2022-09-26 NOTE — Progress Notes (Signed)
Virtual Visit via [facility]   Note   This format is felt to be most appropriate for this patient at this time.  All issues noted in this document were discussed and addressed.  No physical exam was performed (except for noted visual exam findings with Video Visits).   I connected with Carmen Ross  on 09/26/22 at  1:00 PM EST by a video enabled telemedicine application and verified that I am speaking with the correct person using two identifiers. Location patient: home Location provider: work or home office Persons participating in the virtual visit: patient, provider  I discussed the limitations, risks, security and privacy concerns of performing an evaluation and management service by telephone and the availability of in person appointments. I also discussed with the patient that there may be a patient responsible charge related to this service. The patient expressed understanding and agreed to proceed.  Reason for visit: COVID INFECTION SYMPTOM ONSET SATURDAY FEB 17  HPI:  62 YR OLD FEMALE WITH history of tobacco abuse presents with COVID INFECTION.  Symptoms starred 48 hours ago with malaise, then chills,  then dry cough, followed by increased nasal congestion and anorexia.   T max 100.4    covid positive today  geels somewhat better today except for nausea.vomited/dry heaved this morning.     ROS: See pertinent positives and negatives per HPI.  Past Medical History:  Diagnosis Date   Anemia    heavy menstral pepriods 2 years ago, no periods for 14 months.    Chicken pox    Granulosa cell tumor of ovary, right    Headache    sinus related to weather changes   Helicobacter pylori gastritis 03/15/2016       UTI (lower urinary tract infection)     Past Surgical History:  Procedure Laterality Date   [place] SALPINGO OOPHERECTOMY Bilateral 08/05/2016   Procedure: LAPAROSCOPIC BILATERAL SALPINGO OOPHORECTOMY;  Surgeon: Boykin Nearing, MD;  Location: [facility] ORS;  Service: Gynecology;  Laterality: Bilateral;   LAPAROSCOPIC LYSIS OF ADHESIONS  08/05/2016   Procedure: LAPAROSCOPIC LYSIS OF ADHESIONS;  Surgeon: Boykin Nearing, MD;  Location: [facility] ORS;  Service: Gynecology;;   OOPHORECTOMY Bilateral 07/2016    Family History  Problem Relation Age of Onset   Arthritis Mother    Hypertension Mother    Hyperlipidemia Father    Heart disease Father    Hypertension Father    Diabetes Father    Hypertension Maternal Grandmother    Arthritis Maternal Grandmother    Hypertension Maternal Grandfather    Breast cancer Neg Hx     SOCIAL HX:  reports that she has been smoking cigarettes. She started smoking about 40 years ago. She has a 39.00 pack-year smoking history. She has never used smokeless tobacco. She reports current alcohol use. She reports that she does not use drugs.    Current Outpatient Medications:    ALPRAZolam (XANAX) 0.25 MG tablet, Take 1 tablet (0.25 mg total) by mouth daily. As needed for panic attack, Disp: 20 tablet, Rfl: 5   amLODipine (NORVASC) 2.5 MG tablet, TAKE 1 TABLET BY MOUTH DAILY, Disp: 90 tablet, Rfl: 3   guaiFENesin-codeine (CHERATUSSIN AC) 100-10 MG/5ML syrup, Take 5 mLs by mouth 3 (three) times daily as needed for cough., Disp: 120 mL, Rfl: 0   meloxicam (MOBIC) 15 MG tablet, Take 1 tablet (15 mg total) by mouth daily., Disp: 30 tablet, Rfl: 3  omeprazole (PRILOSEC) 40 MG capsule, Take 1 capsule (40 mg total) by mouth daily as needed (heartburn). On an empty stomach, Disp: 90 capsule, Rfl: 3   predniSONE (DELTASONE) 10 MG tablet, 6 tablets on Day 1 , then reduce by 1 tablet daily until gone, Disp: 21 tablet, Rfl: 0   promethazine (PHENERGAN) 12.5 MG tablet, Take 1 tablet (12.5 mg total) by mouth every 6 (six) hours as needed for nausea or vomiting., Disp: 30 tablet, Rfl: 0   RYALTRIS 665-25 MCG/ACT SUSP, SMARTSIG:1 Spray(s) Both Nares Every 12 Hours PRN, Disp: , Rfl:     varenicline (CHANTIX) 1 MG tablet, TAKE 1 TABLET BY MOUTH TWICE  DAILY, Disp: 168 tablet, Rfl: 0  EXAM:  VITALS per patient if applicable:  GENERAL: alert, oriented, appears well and in no acute distress  HEENT: atraumatic, conjunttiva clear, no obvious abnormalities on inspection of external nose and ears  NECK: normal movements of the head and neck  LUNGS: on inspection no signs of respiratory distress, breathing rate appears normal, no obvious gross SOB, gasping or wheezing  CV: no obvious cyanosis  MS: moves all visible extremities without noticeable abnormality  PSYCH/NEURO: pleasant and cooperative, no obvious depression or anxiety, speech and thought processing grossly intact  ASSESSMENT AND PLAN: COVID-19 virus infection Assessment & Plan: Mild, with no complications.  She is not short of breath or febrile  no sinus pain.  Will rx  Phenergan prednisone cheratussin benadryl and afrin    Other orders -     Promethazine HCl; Take 1 tablet (12.5 mg total) by mouth every 6 (six) hours as needed for nausea or vomiting.  Dispense: 30 tablet; Refill: 0 -     Cheratussin AC; Take 5 mLs by mouth 3 (three) times daily as needed for cough.  Dispense: 120 mL; Refill: 0 -     predniSONE; 6 tablets on Day 1 , then reduce by 1 tablet daily until gone  Dispense: 21 tablet; Refill: 0      I discussed the assessment and treatment plan with the patient. The patient was provided an opportunity to ask questions and all were answered. The patient agreed with the plan and demonstrated an understanding of the instructions.   The patient was advised to call back or seek an in-person evaluation if the symptoms worsen or if the condition fails to improve as anticipated.   I spent 20 minutes dedicated to the care of this patient on the date of this encounter to include pre-visit review of his medical history,  Face-to-face time with the patient , and post visit ordering of testing and therapeutics.     Crecencio Mc, MD

## 2022-10-10 ENCOUNTER — Other Ambulatory Visit: Payer: Self-pay | Admitting: Internal Medicine

## 2022-10-13 ENCOUNTER — Ambulatory Visit: Payer: 59 | Admitting: Podiatry

## 2022-10-13 ENCOUNTER — Encounter: Payer: Self-pay | Admitting: Podiatry

## 2022-10-13 DIAGNOSIS — M722 Plantar fascial fibromatosis: Secondary | ICD-10-CM | POA: Diagnosis not present

## 2022-10-13 MED ORDER — MELOXICAM 15 MG PO TABS: 15.0000 mg | ORAL_TABLET | Freq: Every day | ORAL | 3 refills | Status: DC

## 2022-10-13 MED ORDER — TRIAMCINOLONE ACETONIDE 40 MG/ML IJ SUSP
20.0000 mg | Freq: Once | INTRAMUSCULAR | Status: AC
  Administered 2022-10-13: 20 mg

## 2022-10-13 NOTE — Progress Notes (Signed)
Presents today for follow-up of her Planter fasciitis states that it is really hurting more frequently but it just really depends on the activity of the day or the day before.  She states that she was doing well and I discontinued her meloxicam.  She states that she is noticed some tenderness after the discontinuation of the meloxicam she then developed COVID she was less active during COVID but noticed that the pain was significant after rehab from [place] started back on her meloxicam to no avail with her heel pain.  Objective: Vital signs stable tellurian x 3 still has pain on palpation medial calcaneal tubercle of her right heel.  Assessment: Chronic intractable Planter fasciitis right.  Plan: I reinjected the right heel today 20 mg Kenalog 5 mg Marcaine point maximal tenderness.  She is to be casted for orthotics in the near future.  Orders were placed.  Patient she was had a refill on her meloxicam.  She also has methylprednisolone at home which she will start taking on Sunday if no improvement from this injection by that time.  I will follow-up with her once her orthotics come in.

## 2022-10-14 ENCOUNTER — Encounter: Payer: Self-pay | Admitting: Internal Medicine

## 2022-10-14 NOTE — Telephone Encounter (Signed)
PA Verenicline

## 2022-10-17 ENCOUNTER — Other Ambulatory Visit (HOSPITAL_COMMUNITY): Payer: Self-pay

## 2022-10-18 ENCOUNTER — Other Ambulatory Visit (HOSPITAL_COMMUNITY): Payer: Self-pay

## 2022-10-20 ENCOUNTER — Telehealth: Payer: Self-pay

## 2022-10-20 NOTE — Telephone Encounter (Signed)
noted 

## 2022-10-20 NOTE — Telephone Encounter (Signed)
Patient Advocate Encounter   Received notification from [employer] that prior authorization for Varenicline is required.   PA submitted on 10/20/2022 Key [id number] Insurance [employer] Status is pending

## 2022-10-24 NOTE — Telephone Encounter (Signed)
Pharmacy Patient Advocate Encounter  Received notification from [employer] that the request for prior authorization for Varenicline has been denied due to .    Please be advised we currently do not have a Pharmacist to review denials, therefore you will need to process appeals accordingly as needed. Thanks for your support at this time.   You may call [phone] or fax [phone], to appeal.

## 2022-10-27 ENCOUNTER — Other Ambulatory Visit: Payer: Self-pay | Admitting: Family

## 2022-10-27 MED ORDER — VARENICLINE TARTRATE 1 MG PO TABS: 1.0000 mg | ORAL_TABLET | Freq: Two times a day (BID) | ORAL | 0 refills | Status: DC

## 2022-10-31 ENCOUNTER — Other Ambulatory Visit: Payer: 59

## 2022-11-03 NOTE — Telephone Encounter (Signed)
ERROR

## 2022-11-11 ENCOUNTER — Encounter: Payer: Self-pay | Admitting: Pulmonary Disease

## 2022-11-24 ENCOUNTER — Other Ambulatory Visit: Payer: 59

## 2022-11-25 ENCOUNTER — Encounter: Payer: Self-pay | Admitting: Internal Medicine

## 2022-11-25 ENCOUNTER — Ambulatory Visit: Payer: 59 | Admitting: Internal Medicine

## 2022-11-25 VITALS — BP 128/76 | HR 72 | Ht 67.0 in | Wt 180.0 lb

## 2022-11-25 DIAGNOSIS — Z23 Encounter for immunization: Secondary | ICD-10-CM | POA: Diagnosis not present

## 2022-11-25 DIAGNOSIS — Z114 Encounter for screening for human immunodeficiency virus [HIV]: Secondary | ICD-10-CM

## 2022-11-25 DIAGNOSIS — Z720 Tobacco use: Secondary | ICD-10-CM

## 2022-11-25 DIAGNOSIS — J387 Other diseases of larynx: Secondary | ICD-10-CM

## 2022-11-25 DIAGNOSIS — J449 Chronic obstructive pulmonary disease, unspecified: Secondary | ICD-10-CM

## 2022-11-25 DIAGNOSIS — R7301 Impaired fasting glucose: Secondary | ICD-10-CM

## 2022-11-25 DIAGNOSIS — Z1159 Encounter for screening for other viral diseases: Secondary | ICD-10-CM | POA: Diagnosis not present

## 2022-11-25 DIAGNOSIS — I10 Essential (primary) hypertension: Secondary | ICD-10-CM | POA: Diagnosis not present

## 2022-11-25 DIAGNOSIS — C569 Malignant neoplasm of unspecified ovary: Secondary | ICD-10-CM

## 2022-11-25 DIAGNOSIS — E785 Hyperlipidemia, unspecified: Secondary | ICD-10-CM

## 2022-11-25 DIAGNOSIS — Z716 Tobacco abuse counseling: Secondary | ICD-10-CM

## 2022-11-25 DIAGNOSIS — R5383 Other fatigue: Secondary | ICD-10-CM | POA: Diagnosis not present

## 2022-11-25 DIAGNOSIS — I7121 Aneurysm of the ascending aorta, without rupture: Secondary | ICD-10-CM

## 2022-11-25 MED ORDER — DOXYCYCLINE HYCLATE 100 MG PO TABS: 100.0000 mg | ORAL_TABLET | Freq: Two times a day (BID) | ORAL | 0 refills | Status: DC

## 2022-11-25 MED ORDER — BUPROPION HCL ER (SR) 150 MG PO TB12: 150.0000 mg | ORAL_TABLET | Freq: Two times a day (BID) | ORAL | 2 refills | Status: DC

## 2022-11-25 NOTE — Patient Instructions (Addendum)
Trial of wellbutrin SR.  TAKE ONE TIME DAILY IN THE MORNING.  OMIT THE AFTERNOON DOSE.  It is stimulating .  You can wait until you have finished the Chantix but there is no contraindication to using them at the same time/ .   [facility] Fever  can present with fever and a headache, NOT a rash; s the occurrence of these symptoms during spring/summer/fall in the context of recent tick exposure is RMSF until proven otherwise and should be treated immediately with doxycycline.  I have sent this rx to your pharmacy to use if you develop these symptoms.  Let me know if this occurs so we can set you up for the appropriate testing .    The new goals for optimal blood pressure management are 120/70 to 130/80   Please check your blood pressure a few times at home and send me the readings so I can determine if you need a change in medication  .

## 2022-11-25 NOTE — Progress Notes (Unsigned)
Subjective:  Patient ID: Carmen Ross, female    DOB: 07-12-1961  Age: 62 y.o. MRN: [id number]  CC: The primary encounter diagnosis was Essential hypertension. Diagnoses of Screening for HIV (human immunodeficiency virus), Need for hepatitis C screening test, Other fatigue, Hyperlipidemia, unspecified hyperlipidemia type, Impaired fasting glucose, and Need for shingles vaccine were also pertinent to this visit.   HPI Carmen Ross presents for  Chief Complaint  Patient presents with   Medical Management of Chronic Issues    1) Tobacco abuse: using Chantix 1 mg bid since March  2) HTN: using 2.5 mg amlodipine  3)  Insomnia/anxiety:  using alprazolam sparingly   4) HOARSENESS:   SIMPLE cyst per ENT evaluation  by [facility]. .  Taking Ryaltris (olapatadine and mometasone)for allergies   Outpatient Medications Prior to Visit  Medication Sig Dispense Refill   ALPRAZolam (XANAX) 0.25 MG tablet Take 1 tablet (0.25 mg total) by mouth daily. As needed for panic attack 20 tablet 5   amLODipine (NORVASC) 2.5 MG tablet TAKE 1 TABLET BY MOUTH DAILY 90 tablet 3   meloxicam (MOBIC) 15 MG tablet Take 1 tablet (15 mg total) by mouth daily. 30 tablet 3   omeprazole (PRILOSEC) 40 MG capsule Take 1 capsule (40 mg total) by mouth daily as needed (heartburn). On an empty stomach 90 capsule 3   RYALTRIS 665-25 MCG/ACT SUSP SMARTSIG:1 Spray(s) Both Nares Every 12 Hours PRN     varenicline (CHANTIX) 1 MG tablet Take 1 tablet (1 mg total) by mouth 2 (two) times daily. 120 tablet 0   predniSONE (DELTASONE) 10 MG tablet 6 tablets on Day 1 , then reduce by 1 tablet daily until gone 21 tablet 0   No facility-administered medications prior to visit.    Review of Systems;  Patient denies headache, fevers, malaise, unintentional weight loss, skin rash, eye pain, sinus congestion and sinus pain, sore throat, dysphagia,  hemoptysis , cough, dyspnea, wheezing, chest pain, palpitations, orthopnea, edema, abdominal  pain, nausea, melena, diarrhea, constipation, flank pain, dysuria, hematuria, urinary  Frequency, nocturia, numbness, tingling, seizures,  Focal weakness, Loss of consciousness,  Tremor, insomnia, depression, anxiety, and suicidal ideation.      Objective:  BP 132/82   Pulse 72   Ht  (1.702 m)   Wt 180 lb (81.6 kg)   LMP 05/23/2015   SpO2 96%   BMI 28.19 kg/m   BP Readings from Last 3 Encounters:  11/25/22 132/82  05/26/22 126/82  05/24/22 118/82    Wt Readings from Last 3 Encounters:  11/25/22 180 lb (81.6 kg)  09/26/22 177 lb (80.3 kg)  05/26/22 177 lb (80.3 kg)    Physical Exam  Lab Results  Component Value Date   HGBA1C 5.3 04/12/2019    Lab Results  Component Value Date   CREATININE 0.80 01/19/2022   CREATININE 0.73 05/24/2021   CREATININE 0.79 05/22/2020    Lab Results  Component Value Date   WBC 8.5 05/22/2020   HGB 14.2 05/22/2020   HCT 42.8 05/22/2020   PLT 255 05/22/2020   GLUCOSE 90 05/24/2021   CHOL 218 (H) 05/24/2021   TRIG 90 05/24/2021   HDL 68 05/24/2021   LDLCALC 134 (H) 05/24/2021   ALT 15 05/24/2021   AST 18 05/24/2021   NA 141 05/24/2021   K 4.0 05/24/2021   CL 101 05/24/2021   CREATININE 0.80 01/19/2022   BUN 10 05/24/2021   CO2 26 05/24/2021   TSH 0.792 05/24/2021  HGBA1C 5.3 04/12/2019    MM 3D SCREEN BREAST BILATERAL  Result Date: 07/14/2022 CLINICAL DATA:  Screening. EXAM: DIGITAL SCREENING BILATERAL MAMMOGRAM WITH TOMOSYNTHESIS AND CAD TECHNIQUE: Bilateral screening digital craniocaudal and mediolateral oblique mammograms were obtained. Bilateral screening digital breast tomosynthesis was performed. The images were evaluated with computer-aided detection. COMPARISON:  Previous exam(s). ACR Breast Density Category c: The breast tissue is heterogeneously dense, which may obscure small masses. FINDINGS: There are no findings suspicious for malignancy. IMPRESSION: No mammographic evidence of malignancy. A result letter of  this screening mammogram will be mailed directly to the patient. RECOMMENDATION: Screening mammogram in one year. (Code:SM-B-01Y) BI-RADS CATEGORY  1: Negative. Electronically Signed   By: Sherian Rein M.D.   On: 07/14/2022 10:47    Assessment & Plan:  .Essential hypertension  Screening for HIV (human immunodeficiency virus)  Need for hepatitis C screening test  Other fatigue  Hyperlipidemia, unspecified hyperlipidemia type  Impaired fasting glucose  Need for shingles vaccine     I provided 30 minutes of face-to-face time during this encounter reviewing patient's last visit with me, patient's  most recent visit with cardiology,  nephrology,  and neurology,  recent surgical and non surgical procedures, previous  labs and imaging studies, counseling on currently addressed issues,  and post visit ordering to diagnostics and therapeutics .   Follow-up: No follow-ups on file.   Sherlene Shams, MD

## 2022-11-26 LAB — CBC WITH DIFFERENTIAL/PLATELET
Basophils Absolute: 0 10*3/uL (ref 0.0–0.2)
Basos: 0 %
EOS (ABSOLUTE): 0.2 10*3/uL (ref 0.0–0.4)
Eos: 3 %
Hematocrit: 39.5 % (ref 34.0–46.6)
Hemoglobin: 13.6 g/dL (ref 11.1–15.9)
Immature Grans (Abs): 0 10*3/uL (ref 0.0–0.1)
Immature Granulocytes: 0 %
Lymphocytes Absolute: 1.7 10*3/uL (ref 0.7–3.1)
Lymphs: 23 %
MCH: 29.6 pg (ref 26.6–33.0)
MCHC: 34.4 g/dL (ref 31.5–35.7)
MCV: 86 fL (ref 79–97)
Monocytes Absolute: 0.5 10*3/uL (ref 0.1–0.9)
Monocytes: 6 %
Neutrophils Absolute: 5 10*3/uL (ref 1.4–7.0)
Neutrophils: 68 %
Platelets: 262 10*3/uL (ref 150–450)
RBC: 4.59 x10E6/uL (ref 3.77–5.28)
RDW: 13.2 % (ref 11.7–15.4)
WBC: 7.5 10*3/uL (ref 3.4–10.8)

## 2022-11-26 LAB — LIPID PANEL
Chol/HDL Ratio: 3.1 ratio (ref 0.0–4.4)
Cholesterol, Total: 238 mg/dL — ABNORMAL HIGH (ref 100–199)
HDL: 77 mg/dL (ref >39)
LDL Chol Calc (NIH): 147 mg/dL — ABNORMAL HIGH (ref 0–99)
Triglycerides: 82 mg/dL (ref 0–149)
VLDL Cholesterol Cal: 14 mg/dL (ref 5–40)

## 2022-11-26 LAB — HEPATITIS C ANTIBODY: Hep C Virus Ab: NONREACTIVE

## 2022-11-26 LAB — COMPREHENSIVE METABOLIC PANEL
ALT: 17 IU/L (ref 0–32)
AST: 17 IU/L (ref 0–40)
Albumin/Globulin Ratio: 2 (ref 1.2–2.2)
Albumin: 4.7 g/dL (ref 3.9–4.9)
Alkaline Phosphatase: 94 IU/L (ref 44–121)
BUN/Creatinine Ratio: 21 (ref 12–28)
BUN: 16 mg/dL (ref 8–27)
Bilirubin Total: 0.5 mg/dL (ref 0.0–1.2)
CO2: 25 mmol/L (ref 20–29)
Calcium: 9.8 mg/dL (ref 8.7–10.3)
Chloride: 99 mmol/L (ref 96–106)
Creatinine, Ser: 0.76 mg/dL (ref 0.57–1.00)
Globulin, Total: 2.3 g/dL (ref 1.5–4.5)
Glucose: 86 mg/dL (ref 70–99)
Potassium: 4.3 mmol/L (ref 3.5–5.2)
Sodium: 138 mmol/L (ref 134–144)
Total Protein: 7 g/dL (ref 6.0–8.5)
eGFR: 89 mL/min/{1.73_m2} (ref >59)

## 2022-11-26 LAB — LDL CHOLESTEROL, DIRECT: LDL Direct: 151 mg/dL — ABNORMAL HIGH (ref 0–99)

## 2022-11-26 LAB — HEMOGLOBIN A1C
Est. average glucose Bld gHb Est-mCnc: 111 mg/dL
Hgb A1c MFr Bld: 5.5 % (ref 4.8–5.6)

## 2022-11-26 LAB — TSH: TSH: 0.906 u[IU]/mL (ref 0.450–4.500)

## 2022-11-26 LAB — HIV ANTIBODY (ROUTINE TESTING W REFLEX): HIV Screen 4th Generation wRfx: NONREACTIVE

## 2022-11-27 DIAGNOSIS — J449 Chronic obstructive pulmonary disease, unspecified: Secondary | ICD-10-CM | POA: Insufficient documentation

## 2022-11-27 DIAGNOSIS — E785 Hyperlipidemia, unspecified: Secondary | ICD-10-CM | POA: Insufficient documentation

## 2022-11-27 DIAGNOSIS — J387 Other diseases of larynx: Secondary | ICD-10-CM | POA: Insufficient documentation

## 2022-11-27 NOTE — Assessment & Plan Note (Signed)
The patient [place] aware of  the dangers of tobacco use, including progression of her COPD and of her aorti aneurysm including risk for AMI, and is activityly trying to quit using Chantix.  We  Reviewed strategies to maximize success, including adding wellbutrin, removing cigarettes and smoking materials from environment.\, and developing an alternative calming activity

## 2022-11-27 NOTE — Assessment & Plan Note (Signed)
Reportedly found by Dr Andee Poles during recent ENT evaluation for hoarseness.

## 2022-11-27 NOTE — Assessment & Plan Note (Signed)
Excellent control with 2.5 mg amlodipine based on home readings.  No  Change unless home readings are > 120/70 

## 2022-11-27 NOTE — Assessment & Plan Note (Addendum)
Her last pelvic exam was Oct 2023, by me.   PAP smear done.

## 2022-11-27 NOTE — Assessment & Plan Note (Addendum)
10 yr risk of CAD using the AHA risk calculator is  9.95 %.  Patient has  incidental evidence of mild scattered aortic atherosclerosis .  Placqiue stabilization with statin trial is advised at this time

## 2022-11-27 NOTE — Assessment & Plan Note (Signed)
She has reduced her smoking but not quit completely with use of chantix which is no longer covered by her insurance.  Changing to wellbutrin once daily dosing

## 2022-11-27 NOTE — Assessment & Plan Note (Addendum)
Annual follow up is due in June with CTA ordered by [facility].  AA was found incidentally on CT chest for lung CA screening .  She was referred to [facility]othoracic [facility] [place] in [place].   CTA was done June 2013:  "Ascending aneurysm-measures about 4.1 cm.  No indication for intervention.  Needs annual follow-up.  Importance of blood pressure control emphasized. "

## 2022-12-02 ENCOUNTER — Encounter: Payer: Self-pay | Admitting: Pulmonary Disease

## 2022-12-06 ENCOUNTER — Ambulatory Visit: Payer: 59

## 2022-12-07 ENCOUNTER — Encounter: Payer: Self-pay | Admitting: Internal Medicine

## 2022-12-20 ENCOUNTER — Other Ambulatory Visit: Payer: Self-pay | Admitting: Thoracic Surgery (Cardiothoracic Vascular Surgery)

## 2022-12-20 DIAGNOSIS — I7121 Aneurysm of the ascending aorta, without rupture: Secondary | ICD-10-CM

## 2022-12-23 ENCOUNTER — Ambulatory Visit: Payer: 59 | Admitting: Internal Medicine

## 2022-12-23 ENCOUNTER — Encounter: Payer: Self-pay | Admitting: Internal Medicine

## 2022-12-23 VITALS — BP 122/78 | HR 81 | Temp 97.9°F | Resp 16 | Ht 67.5 in | Wt 178.6 lb

## 2022-12-23 DIAGNOSIS — Z716 Tobacco abuse counseling: Secondary | ICD-10-CM

## 2022-12-23 DIAGNOSIS — I700 Atherosclerosis of aorta: Secondary | ICD-10-CM

## 2022-12-23 DIAGNOSIS — I10 Essential (primary) hypertension: Secondary | ICD-10-CM | POA: Diagnosis not present

## 2022-12-23 DIAGNOSIS — I7121 Aneurysm of the ascending aorta, without rupture: Secondary | ICD-10-CM

## 2022-12-23 DIAGNOSIS — E785 Hyperlipidemia, unspecified: Secondary | ICD-10-CM

## 2022-12-23 DIAGNOSIS — Z1211 Encounter for screening for malignant neoplasm of colon: Secondary | ICD-10-CM

## 2022-12-23 DIAGNOSIS — J387 Other diseases of larynx: Secondary | ICD-10-CM | POA: Diagnosis not present

## 2022-12-23 MED ORDER — ROSUVASTATIN CALCIUM 10 MG PO TABS: 10.0000 mg | ORAL_TABLET | Freq: Every day | ORAL | 0 refills | Status: DC

## 2022-12-23 NOTE — Progress Notes (Unsigned)
Subjective:  Patient ID: [name], female    DOB: 1960-11-10  Age: 62 y.o. MRN: [id number]  CC: There were no encounter diagnoses.   HPI NORRIE LEVANDOWSKI presents for  Chief Complaint  Patient presents with   Medical Management of Chronic Issues      Outpatient Medications Prior to Visit  Medication Sig Dispense Refill   ALPRAZolam (XANAX) 0.25 MG tablet Take 1 tablet (0.25 mg total) by mouth daily. As needed for panic attack 20 tablet 5   amLODipine (NORVASC) 2.5 MG tablet TAKE 1 TABLET BY MOUTH DAILY 90 tablet 3   buPROPion (WELLBUTRIN SR) 150 MG 12 hr tablet Take 1 tablet (150 mg total) by mouth 2 (two) times daily. 60 tablet 2   doxycycline (VIBRA-TABS) 100 MG tablet Take 1 tablet (100 mg total) by mouth 2 (two) times daily. 14 tablet 0   meloxicam (MOBIC) 15 MG tablet Take 1 tablet (15 mg total) by mouth daily. 30 tablet 3   omeprazole (PRILOSEC) 40 MG capsule Take 1 capsule (40 mg total) by mouth daily as needed (heartburn). On an empty stomach 90 capsule 3   RYALTRIS 665-25 MCG/ACT SUSP SMARTSIG:1 Spray(s) Both Nares Every 12 Hours PRN     varenicline (CHANTIX) 1 MG tablet Take 1 tablet (1 mg total) by mouth 2 (two) times daily. 120 tablet 0   No facility-administered medications prior to visit.    Review of Systems;  Patient denies headache, fevers, malaise, unintentional weight loss, skin rash, eye pain, sinus congestion and sinus pain, sore throat, dysphagia,  hemoptysis , cough, dyspnea, wheezing, chest pain, palpitations, orthopnea, edema, abdominal pain, nausea, melena, diarrhea, constipation, flank pain, dysuria, hematuria, urinary  Frequency, nocturia, numbness, tingling, seizures,  Focal weakness, Loss of consciousness,  Tremor, insomnia, depression, anxiety, and suicidal ideation.      Objective:  BP 122/78   Pulse 81   Temp 97.9 F (36.6 C)   Resp 16   Ht 5' 7.5" (1.715 m)   Wt 178 lb 9.6 oz (81 kg)   LMP 05/23/2015   SpO2 99%   BMI 27.56 kg/m    BP Readings from Last 3 Encounters:  12/23/22 122/78  11/25/22 128/76  05/26/22 126/82    Wt Readings from Last 3 Encounters:  12/23/22 178 lb 9.6 oz (81 kg)  11/25/22 180 lb (81.6 kg)  09/26/22 177 lb (80.3 kg)    Physical Exam  Lab Results  Component Value Date   HGBA1C 5.5 11/25/2022   HGBA1C 5.3 04/12/2019    Lab Results  Component Value Date   CREATININE 0.76 11/25/2022   CREATININE 0.80 01/19/2022   CREATININE 0.73 05/24/2021    Lab Results  Component Value Date   WBC 7.5 11/25/2022   HGB 13.6 11/25/2022   HCT 39.5 11/25/2022   PLT 262 11/25/2022   GLUCOSE 86 11/25/2022   CHOL 238 (H) 11/25/2022   TRIG 82 11/25/2022   HDL 77 11/25/2022   LDLDIRECT 151 (H) 11/25/2022   LDLCALC 147 (H) 11/25/2022   ALT 17 11/25/2022   AST 17 11/25/2022   NA 138 11/25/2022   K 4.3 11/25/2022   CL 99 11/25/2022   CREATININE 0.76 11/25/2022   BUN 16 11/25/2022   CO2 25 11/25/2022   TSH 0.906 11/25/2022   HGBA1C 5.5 11/25/2022    MM 3D SCREEN BREAST BILATERAL  Result Date: 07/14/2022 CLINICAL DATA:  Screening. EXAM: DIGITAL SCREENING BILATERAL MAMMOGRAM WITH TOMOSYNTHESIS AND CAD TECHNIQUE: Bilateral screening digital craniocaudal and  mediolateral oblique mammograms were obtained. Bilateral screening digital breast tomosynthesis was performed. The images were evaluated with computer-aided detection. COMPARISON:  Previous exam(s). ACR Breast Density Category c: The breast tissue is heterogeneously dense, which may obscure small masses. FINDINGS: There are no findings suspicious for malignancy. IMPRESSION: No mammographic evidence of malignancy. A result letter of this screening mammogram will be mailed directly to the patient. RECOMMENDATION: Screening mammogram in one year. (Code:SM-B-01Y) BI-RADS CATEGORY  1: Negative. Electronically Signed   By: Sherian Rein M.D.   On: 07/14/2022 10:47    Assessment & Plan:  .There are no diagnoses linked to this encounter.   I  provided 30 minutes of face-to-face time during this encounter reviewing patient's last visit with me, patient's  most recent visit with cardiology,  nephrology,  and neurology,  recent surgical and non surgical procedures, previous  labs and imaging studies, counseling on currently addressed issues,  and post visit ordering to diagnostics and therapeutics .   Follow-up: No follow-ups on file.   Sherlene Shams, MD

## 2022-12-23 NOTE — Patient Instructions (Signed)
Your BP is fine!  No changes unless your home readings become persistently >  160 (since we are subtracting 30 pts to calibrate it)   Trial of generic Crestor:  once daily.  Return to [facility] in 3- 4 weeks for non fasting labs (CK,  CMET)  Stop the medication and Let me know if you develop muscle pain

## 2022-12-25 ENCOUNTER — Encounter: Payer: Self-pay | Admitting: Internal Medicine

## 2022-12-25 DIAGNOSIS — I700 Atherosclerosis of aorta: Secondary | ICD-10-CM | POA: Insufficient documentation

## 2022-12-25 NOTE — Assessment & Plan Note (Signed)
Will consider change in BP to beta blocker

## 2022-12-25 NOTE — Assessment & Plan Note (Addendum)
With cardiomegaly and ascending aortic aneursym noed on CA chest done by CTS  BP is at  goal of  120/70 or less on minimal dose of amlodipine.  Will add low dose beta blocker if additional  control is needed .

## 2022-12-25 NOTE — Assessment & Plan Note (Signed)
"  Still working on it"  encouragement given

## 2022-12-25 NOTE — Assessment & Plan Note (Signed)
Aortic atherosclerosis :  Discussed need for statin therapy given documented evidence of moderate  atherosclerosis in the aorta noted on recent  CT of abdomen and  pelvis and the prognostic implications of this finding.   She is ready to start Crestor .

## 2022-12-26 MED ORDER — ROSUVASTATIN CALCIUM 10 MG PO TABS: 10.0000 mg | ORAL_TABLET | Freq: Every day | ORAL | 1 refills | Status: DC

## 2023-01-12 ENCOUNTER — Encounter: Payer: Self-pay | Admitting: Pulmonary Disease

## 2023-01-18 ENCOUNTER — Other Ambulatory Visit: Payer: Self-pay | Admitting: Internal Medicine

## 2023-01-18 DIAGNOSIS — B9681 Helicobacter pylori [H. pylori] as the cause of diseases classified elsewhere: Secondary | ICD-10-CM

## 2023-01-23 LAB — LIPID PANEL
Cholesterol: 190 (ref 0–200)
HDL: 73 — AB (ref 35–70)
LDL Cholesterol: 104
Triglycerides: 72 (ref 40–160)

## 2023-01-23 LAB — BASIC METABOLIC PANEL: Glucose: 93

## 2023-01-23 LAB — HEMOGLOBIN A1C: Hemoglobin A1C: 5.4

## 2023-01-24 ENCOUNTER — Ambulatory Visit
Admission: RE | Admit: 2023-01-24 | Discharge: 2023-01-24 | Disposition: A | Discharge status: Home / Self Care | Payer: 59 | Source: Ambulatory Visit | Attending: Thoracic Surgery (Cardiothoracic Vascular Surgery) | Admitting: Thoracic Surgery (Cardiothoracic Vascular Surgery)

## 2023-01-24 DIAGNOSIS — I7121 Aneurysm of the ascending aorta, without rupture: Secondary | ICD-10-CM

## 2023-01-24 MED ORDER — IOPAMIDOL (ISOVUE-370) INJECTION 76%
75.0000 mL | Freq: Once | INTRAVENOUS | Status: AC | PRN
  Administered 2023-01-24: 75 mL via INTRAVENOUS

## 2023-01-26 ENCOUNTER — Encounter: Payer: Self-pay | Admitting: Internal Medicine

## 2023-01-31 ENCOUNTER — Ambulatory Visit: Payer: 59 | Admitting: Thoracic Surgery (Cardiothoracic Vascular Surgery)

## 2023-01-31 ENCOUNTER — Encounter: Payer: Self-pay | Admitting: Thoracic Surgery (Cardiothoracic Vascular Surgery)

## 2023-01-31 VITALS — BP 135/79 | HR 85 | Resp 20 | Ht 67.5 in | Wt 179.0 lb

## 2023-01-31 DIAGNOSIS — I7121 Aneurysm of the ascending aorta, without rupture: Secondary | ICD-10-CM | POA: Diagnosis not present

## 2023-01-31 NOTE — Progress Notes (Signed)
[address]       [place] [address]             [phone]     HPI: Mrs. Carmen Ross returns for follow-up of her ascending aneurysm  Carmen Ross is a 62 year old woman with history of tobacco abuse, hypertension, ovarian cancer, reflux, and an ascending aortic aneurysm.  She has about a 40-pack-year history of smoking.  She had a low-dose CT for lung cancer screening in 2023.  It showed a RADS 4B lesion in the right upper lobe apex.  There also was an ascending aneurysm.  A 45-month follow-up scan there was apical scarring and some smoking-related respiratory bronchiolitis, but no suspicious lung nodules.  Over the past year she has been doing well.  She is not having any chest pain, pressure, tightness, or shortness of breath.  She was able to quit smoking altogether in mid May.  Past Medical History:  Diagnosis Date   Anemia    heavy menstral pepriods 2 years ago, no periods for 14 months.    Chicken pox    Granulosa cell tumor of ovary, right    Headache    sinus related to weather changes   Helicobacter pylori gastritis 03/15/2016       UTI (lower urinary tract infection)     Current Outpatient Medications  Medication Sig Dispense Refill   ALPRAZolam (XANAX) 0.25 MG tablet Take 1 tablet (0.25 mg total) by mouth daily. As needed for panic attack 20 tablet 5   amLODipine (NORVASC) 2.5 MG tablet TAKE 1 TABLET BY MOUTH DAILY 90 tablet 3   buPROPion (WELLBUTRIN SR) 150 MG 12 hr tablet Take 1 tablet (150 mg total) by mouth 2 (two) times daily. 60 tablet 2   doxycycline (VIBRA-TABS) 100 MG tablet Take 1 tablet (100 mg total) by mouth 2 (two) times daily. 14 tablet 0   meloxicam (MOBIC) 15 MG tablet Take 1 tablet (15 mg total) by mouth daily. 30 tablet 3   omeprazole (PRILOSEC) 40 MG capsule TAKE 1 CAPSULE BY MOUTH DAILY AS NEEDED FOR HEARTBURN ON AN EMPTY STOMACH 90 capsule 3   rosuvastatin (CRESTOR) 10 MG tablet Take 1 tablet (10 mg total) by mouth daily. 90  tablet 1   RYALTRIS 665-25 MCG/ACT SUSP SMARTSIG:1 Spray(s) Both Nares Every 12 Hours PRN     varenicline (CHANTIX) 1 MG tablet Take 1 tablet (1 mg total) by mouth 2 (two) times daily. 120 tablet 0   No current facility-administered medications for this visit.    Physical Exam BP 135/79   Pulse 85   Resp 20   Ht 5' 7.5" (1.715 m)   Wt 179 lb (81.2 kg)   LMP 05/23/2015   SpO2 93% Comment: RA  BMI 27.65 kg/m  62 year old woman in no acute distress Alert and oriented x 3 with no focal deficits Lungs clear bilaterally No carotid bruits Cardiac regular rate and rhythm with normal S1 and S2, no murmur  Diagnostic Tests: CT ANGIOGRAPHY CHEST WITH CONTRAST   TECHNIQUE: Multidetector CT imaging of the chest was performed using the standard protocol during bolus administration of intravenous contrast. Multiplanar CT image reconstructions and MIPs were obtained to evaluate the vascular anatomy.   RADIATION DOSE REDUCTION: This exam was performed according to the departmental dose-optimization program which includes automated exposure control, adjustment of the mA and/or kV according to patient size and/or use of iterative reconstruction technique.   CONTRAST:  75mL ISOVUE-370 IOPAMIDOL (ISOVUE-370) INJECTION  76%   COMPARISON:  January 19, 2022.   FINDINGS: Cardiovascular: 4.2 cm ascending thoracic aortic aneurysm. No dissection is noted. Normal cardiac size. No pericardial effusion.   Mediastinum/Nodes: No enlarged mediastinal, hilar, or axillary lymph nodes. Thyroid gland, trachea, and esophagus demonstrate no significant findings.   Lungs/Pleura: No pneumothorax or pleural effusion is noted. Mild biapical scarring is noted. No acute pulmonary disease is noted.   Upper Abdomen: No acute abnormality.   Musculoskeletal: No chest wall abnormality. No acute or significant osseous findings.   Review of the MIP images confirms the above findings.   IMPRESSION: Grossly stable  4.2 cm ascending thoracic aortic aneurysm. Recommend annual imaging followup by CTA or MRA. This recommendation follows 2010 ACCF/AHA/AATS/ACR/ASA/SCA/SCAI/SIR/STS/SVM Guidelines for the Diagnosis and Management of Patients with Thoracic Aortic Disease. Circulation. 2010; 121: Z610-R604. Aortic aneurysm NOS (ICD10-I71.9).   Aortic Atherosclerosis (ICD10-I70.0).     Electronically Signed   By: Lupita Raider M.D.   On: 01/24/2023 11:08 I personally reviewed the CT images.  No change in biapical scarring.  4.2 cm ascending aneurysm.  Mild dilatation proximal descending aorta.  Impression: Carmen Ross is a 62 year old woman with history of tobacco abuse, hypertension, ovarian cancer, reflux, and an ascending aortic aneurysm.    Ascending aneurysm-stable at 4.2 cm.  Needs continued annual follow-up.  Hypertension-blood pressure well-controlled on current regimen.  Typically runs in the 1 20-1 30 systolic range at home.  Tobacco use-quit smoking altogether in May.  Congratulated her for that and emphasized the importance of continued abstinence.  Plan: Return in 1 year with CT chest  Carmen Slot, MD [facility] [phone]

## 2023-02-15 ENCOUNTER — Other Ambulatory Visit: Payer: Self-pay | Admitting: *Deleted

## 2023-02-15 DIAGNOSIS — Z87891 Personal history of nicotine dependence: Secondary | ICD-10-CM

## 2023-02-15 DIAGNOSIS — F17210 Nicotine dependence, cigarettes, uncomplicated: Secondary | ICD-10-CM

## 2023-02-15 DIAGNOSIS — Z122 Encounter for screening for malignant neoplasm of respiratory organs: Secondary | ICD-10-CM

## 2023-02-21 ENCOUNTER — Encounter: Payer: Self-pay | Admitting: Nurse Practitioner

## 2023-02-21 ENCOUNTER — Ambulatory Visit: Payer: 59 | Admitting: Nurse Practitioner

## 2023-02-21 VITALS — BP 136/82 | HR 81 | Temp 97.8°F | Ht 67.5 in | Wt 177.4 lb

## 2023-02-21 DIAGNOSIS — N3289 Other specified disorders of bladder: Secondary | ICD-10-CM | POA: Diagnosis not present

## 2023-02-21 DIAGNOSIS — N3281 Overactive bladder: Secondary | ICD-10-CM

## 2023-02-21 DIAGNOSIS — R399 Unspecified symptoms and signs involving the genitourinary system: Secondary | ICD-10-CM

## 2023-02-21 LAB — POC URINALSYSI DIPSTICK (AUTOMATED)
Blood, UA: NEGATIVE
Glucose, UA: NEGATIVE
Ketones, UA: POSITIVE
Leukocytes, UA: NEGATIVE
Nitrite, UA: NEGATIVE
Protein, UA: NEGATIVE
Spec Grav, UA: >=1.03 — AB (ref 1.010–1.025)
Urobilinogen, UA: 1 E.U./dL
pH, UA: 5.5 (ref 5.0–8.0)

## 2023-02-21 NOTE — Progress Notes (Signed)
Established Patient Office Visit  Subjective:  Patient ID: Carmen Ross, female    DOB: Sep 21, 1960  Age: 62 y.o. MRN: [id number]  CC:  Chief Complaint  Patient presents with   Urinary Tract Infection    Cramping after urinating since Friday     HPI  Carmen Ross presents for:  Urinary Tract Infection  This is a new problem. The current episode started in the past 7 days. The problem occurs every urination. The problem has been unchanged. There has been no fever. Associated symptoms include frequency and urgency. Pertinent negatives include no chills, discharge or hesitancy.     Past Medical History:  Diagnosis Date   Anemia    heavy menstral pepriods 2 years ago, no periods for 14 months.    Chicken pox    Granulosa cell tumor of ovary, right    Headache    sinus related to weather changes   Helicobacter pylori gastritis 03/15/2016       UTI (lower urinary tract infection)     Past Surgical History:  Procedure Laterality Date   CESAREAN SECTION  1988   HERNIA REPAIR  1968   LAPAROSCOPIC BILATERAL SALPINGO OOPHERECTOMY Bilateral 08/05/2016   Procedure: LAPAROSCOPIC BILATERAL SALPINGO OOPHORECTOMY;  Surgeon: Suzy Bouchard, MD;  Location: [facility] ORS;  Service: Gynecology;  Laterality: Bilateral;   LAPAROSCOPIC LYSIS OF ADHESIONS  08/05/2016   Procedure: LAPAROSCOPIC LYSIS OF ADHESIONS;  Surgeon: Suzy Bouchard, MD;  Location: [facility] OR[facility];  Service: Gynecology;;   OOPHORECTOMY Bilateral 07/2016    Family History  Problem Relation Age of Onset   Arthritis Mother    Hypertension Mother    Hyperlipidemia Father    Heart disease Father    Hypertension Father    Diabetes Father    Hypertension Maternal Grandmother    Arthritis Maternal Grandmother    Hypertension Maternal Grandfather    Breast cancer Neg Hx     Social History   Socioeconomic History   Marital status: Married    Spouse name: Not on file   Number of children: Not on file   Years of  education: Not on file   Highest education level: Master's degree (e.g., MA, MS, MEng, MEd, MSW, MBA)  Occupational History   Not on file  Tobacco Use   Smoking status: Former    Current packs/day: 0.00    Average packs/day: 1 pack/day for 40.7 years (40.7 ttl pk-yrs)    Types: Cigarettes    Start date: 03/27/1982    Quit date: 12/21/2022    Years since quitting: 0.2   Smokeless tobacco: Never   Tobacco comments:    Currently on Chantix--8 cigarettes daily--01/18/2022    5-6 cigarettes a day 05/24/2022  Substance and Sexual Activity   Alcohol use: Yes    Comment: occasional very rare glass of wine with holiday, 5-6 per year   Drug use: No   Sexual activity: Yes  Other Topics Concern   Not on file  Social History Narrative   Not on file   Social Determinants of Health   Financial Resource Strain: Low Risk  (12/23/2022)   Overall Financial Resource Strain (CARDIA)    Difficulty of Paying Living Expenses: Not hard at all  Food Insecurity: No Food Insecurity (12/23/2022)   Hunger Vital Sign    Worried About Running Out of Food in the Last Year: Never true    Ran Out of Food in the Last Year: Never true  Transportation Needs: No Transportation Needs (  12/23/2022)   PRAPARE - Administrator, Civil Service (Medical): No    Lack of Transportation (Non-Medical): No  Physical Activity: Sufficiently Active (12/23/2022)   Exercise Vital Sign    Days of Exercise per Week: 7 days    Minutes of Exercise per Session: 50 min  Stress: No Stress Concern Present (12/23/2022)   [employer] of Occupational Health - Occupational Stress Questionnaire    Feeling of Stress : Only a little  Social Connections: Socially Integrated (12/23/2022)   Social Connection and Isolation Panel [NHANES]    Frequency of Communication with Friends and Family: More than three times a week    Frequency of Social Gatherings with Friends and Family: Three times a week    Attends Religious Services: More  than 4 times per year    Active Member of Clubs or Organizations: Yes    Attends Engineer, structural: More than 4 times per year    Marital Status: Married  Catering manager Violence: Not on file     Outpatient Medications Prior to Visit  Medication Sig Dispense Refill   ALPRAZolam (XANAX) 0.25 MG tablet Take 1 tablet (0.25 mg total) by mouth daily. As needed for panic attack 20 tablet 5   amLODipine (NORVASC) 2.5 MG tablet TAKE 1 TABLET BY MOUTH DAILY 90 tablet 3   buPROPion (WELLBUTRIN SR) 150 MG 12 hr tablet Take 1 tablet (150 mg total) by mouth 2 (two) times daily. 60 tablet 2   omeprazole (PRILOSEC) 40 MG capsule TAKE 1 CAPSULE BY MOUTH DAILY AS NEEDED FOR HEARTBURN ON AN EMPTY STOMACH 90 capsule 3   rosuvastatin (CRESTOR) 10 MG tablet Take 1 tablet (10 mg total) by mouth daily. 90 tablet 1   RYALTRIS 665-25 MCG/ACT SUSP SMARTSIG:1 Spray(s) Both Nares Every 12 Hours PRN     doxycycline (VIBRA-TABS) 100 MG tablet Take 1 tablet (100 mg total) by mouth 2 (two) times daily. 14 tablet 0   meloxicam (MOBIC) 15 MG tablet Take 1 tablet (15 mg total) by mouth daily. 30 tablet 3   varenicline (CHANTIX) 1 MG tablet Take 1 tablet (1 mg total) by mouth 2 (two) times daily. 120 tablet 0   No facility-administered medications prior to visit.    Allergies  Allergen Reactions   Tape Rash    ROS Review of Systems  Constitutional:  Negative for chills.  Genitourinary:  Positive for frequency and urgency. Negative for hesitancy.   Negative unless indicated in HPI.    Objective:    Physical Exam Constitutional:      Appearance: Normal appearance.  Cardiovascular:     Rate and Rhythm: Normal rate and regular rhythm.     Pulses: Normal pulses.     Heart sounds: Normal heart sounds.  Abdominal:     General: Bowel sounds are normal.     Palpations: Abdomen is soft.     Tenderness: There is no abdominal tenderness. There is no right CVA tenderness or left CVA tenderness.   Musculoskeletal:     Cervical back: Normal range of motion.  Neurological:     General: No focal deficit present.     Mental Status: She is alert. Mental status is at baseline.  Psychiatric:        Mood and Affect: Mood normal.        Behavior: Behavior normal.        Thought Content: Thought content normal.        Judgment: Judgment normal.  BP 136/82   Pulse 81   Temp 97.8 F (36.6 C)   Ht 5' 7.5" (1.715 m)   Wt 177 lb 6.4 oz (80.5 kg)   LMP 05/23/2015   SpO2 98%   BMI 27.37 kg/m  Wt Readings from Last 3 Encounters:  02/21/23 177 lb 6.4 oz (80.5 kg)  01/31/23 179 lb (81.2 kg)  12/23/22 178 lb 9.6 oz (81 kg)     Health Maintenance  Topic Date Due   COVID-19 Vaccine (4 - 2023-24 season) 04/08/2022   INFLUENZA VACCINE  03/09/2023   Lung Cancer Screening  01/24/2024   MAMMOGRAM  07/13/2024   Colonoscopy  01/26/2025   PAP SMEAR-Modifier  05/26/2025   DTaP/Tdap/Td (2 - Td or Tdap) 05/22/2030   Hepatitis C Screening  Completed   HIV Screening  Completed   Zoster Vaccines- Shingrix  Completed   HPV VACCINES  Aged Out    There are no preventive care reminders to display for this patient.  Lab Results  Component Value Date   TSH 0.906 11/25/2022   Lab Results  Component Value Date   WBC 7.5 11/25/2022   HGB 13.6 11/25/2022   HCT 39.5 11/25/2022   MCV 86 11/25/2022   PLT 262 11/25/2022   Lab Results  Component Value Date   NA 138 11/25/2022   K 4.3 11/25/2022   CO2 25 11/25/2022   GLUCOSE 86 11/25/2022   BUN 16 11/25/2022   CREATININE 0.76 11/25/2022   BILITOT 0.5 11/25/2022   ALKPHOS 94 11/25/2022   AST 17 11/25/2022   ALT 17 11/25/2022   PROT 7.0 11/25/2022   ALBUMIN 4.7 11/25/2022   CALCIUM 9.8 11/25/2022   ANIONGAP 10 02/20/2019   EGFR 89 11/25/2022   Lab Results  Component Value Date   CHOL 190 01/23/2023   Lab Results  Component Value Date   HDL 73 (A) 01/23/2023   Lab Results  Component Value Date   LDLCALC 104 01/23/2023    Lab Results  Component Value Date   TRIG 72 01/23/2023   Lab Results  Component Value Date   CHOLHDL 3.1 11/25/2022   Lab Results  Component Value Date   HGBA1C 5.4 01/23/2023      Assessment & Plan:  Bladder spasms -     POCT Urinalysis Dipstick (Automated) -     Urine Culture -     Urine Microscopic  Urgency-frequency syndrome Assessment & Plan: POCT urinalysis negative for nitrites, hematuria and leukocytes. Culture and microscopy pending. Advised patient to increase fluid intake and take over-the-counter AZO for pain.  Orders: -     POCT Urinalysis Dipstick (Automated) -     Urine Culture -     Urine Microscopic  Other orders -     Specimen status report -     Specimen status report    Follow-up: No follow-ups on file.   Kara Dies, NP

## 2023-02-22 LAB — URINALYSIS, MICROSCOPIC ONLY
Bacteria, UA: NONE SEEN
Casts: NONE SEEN /lpf
Epithelial Cells (non renal): NONE SEEN /hpf (ref 0–10)
WBC, UA: NONE SEEN /hpf (ref 0–5)

## 2023-02-22 LAB — SPECIMEN STATUS REPORT

## 2023-02-23 LAB — SPECIMEN STATUS REPORT

## 2023-02-23 LAB — URINE CULTURE: Organism ID, Bacteria: NO GROWTH

## 2023-02-26 ENCOUNTER — Encounter: Payer: Self-pay | Admitting: Nurse Practitioner

## 2023-02-27 NOTE — Telephone Encounter (Signed)
NOTED

## 2023-03-05 DIAGNOSIS — N3281 Overactive bladder: Secondary | ICD-10-CM | POA: Insufficient documentation

## 2023-03-05 DIAGNOSIS — N3289 Other specified disorders of bladder: Secondary | ICD-10-CM | POA: Insufficient documentation

## 2023-03-05 NOTE — Assessment & Plan Note (Signed)
POCT urinalysis negative for nitrites, hematuria and leukocytes. Culture and microscopy pending. Advised patient to increase fluid intake and take over-the-counter AZO for pain.

## 2023-03-06 ENCOUNTER — Encounter: Payer: Self-pay | Admitting: Primary Care

## 2023-03-06 ENCOUNTER — Ambulatory Visit: Payer: 59 | Admitting: Primary Care

## 2023-03-06 VITALS — BP 126/76 | HR 84 | Temp 97.9°F | Ht 67.5 in | Wt 176.6 lb

## 2023-03-06 DIAGNOSIS — Z720 Tobacco use: Secondary | ICD-10-CM | POA: Diagnosis not present

## 2023-03-06 DIAGNOSIS — J449 Chronic obstructive pulmonary disease, unspecified: Secondary | ICD-10-CM | POA: Diagnosis not present

## 2023-03-06 DIAGNOSIS — J84115 Respiratory bronchiolitis interstitial lung disease: Secondary | ICD-10-CM | POA: Diagnosis not present

## 2023-03-06 NOTE — Assessment & Plan Note (Addendum)
-   Stable; No daily respiratory symptoms. Minimal dyspnea with inclines. Intermittent cough due to allergen exposure - Not on daily maintenance inhaler, does not require SABA - Continue under observation - FU 1 year or sooner

## 2023-03-06 NOTE — Progress Notes (Signed)
@Patient  ID: Carmen Ross, female    DOB: [birth year], 62 y.o.   MRN: [id number]  Chief Complaint  Patient presents with   Follow-up    Occ prod cough with clear sputum.     Referring provider: Sherlene Shams, MD  HPI: 62 year old female, former smoker.  Past medical history significant for hypertension, aortic arthrosclerosis, aortic aneurysm, stage I mild COPD, ovarian cancer, COVID-19, hyperlipidemia, tobacco abuse.  03/06/2023 Patient presents today for a annual follow-up mild COPD/RB-ILD. She is doing well. No acute respiratory complaints. She experiences upper airway cough/throat clearing after being around family dog. She has minimal dyspnea with inclines.  PFTs in October 2023 showed minimal obstructive airway disease on PFTs in October 2023  She quit smoking as of June for the most part, she still on occasion has one cigarette a week. Taking Wellbutin 150mg  twice daily to help with smoking cessation, RX filled by her primary care  CTA in June 2024 showed stable mild biapical scarring. No acute pulmonary disease. Denies f/c/s, shortness of breath, chest tightness or wheezing   Pulmonary function testing: 05/17/22 >> FVC 3.08 (82%), FEV 2.28 (79%), ratio 92, DLCO 18.14 (80%)   Allergies  Allergen Reactions   Tape Rash    Immunization History  Administered Date(s) Administered   Influenza Split 05/12/2014, 05/08/2018   Influenza,inj,Quad PF,6+ Mos 05/24/2021, 05/24/2022   Influenza-Unspecified 05/13/2015, 05/06/2020, 05/14/2020   Moderna SARS-COV2 Booster Vaccination 05/28/2021   Moderna Sars-Covid-2 Vaccination 10/04/2019, 11/01/2019, 06/04/2020   Tdap 05/22/2020   Zoster Recombinant(Shingrix) 05/26/2022, 11/25/2022    Past Medical History:  Diagnosis Date   Anemia    heavy menstral pepriods 2 years ago, no periods for 14 months.    Chicken pox    Granulosa cell tumor of ovary, right    Headache    sinus related to weather changes   Helicobacter pylori  gastritis 03/15/2016       UTI (lower urinary tract infection)     Tobacco History: Social History   Tobacco Use  Smoking Status Former   Current packs/day: 0.00   Average packs/day: 1 pack/day for 40.7 years (40.7 ttl pk-yrs)   Types: Cigarettes   Start date: 03/27/1982   Quit date: 12/21/2022   Years since quitting: 0.2  Smokeless Tobacco Never  Tobacco Comments   Currently on Chantix--8 cigarettes daily--01/18/2022   5-6 cigarettes a day 05/24/2022   Counseling given: Not Answered Tobacco comments: Currently on Chantix--8 cigarettes daily--01/18/2022 5-6 cigarettes a day 05/24/2022   Outpatient Medications Prior to Visit  Medication Sig Dispense Refill   ALPRAZolam (XANAX) 0.25 MG tablet Take 1 tablet (0.25 mg total) by mouth daily. As needed for panic attack 20 tablet 5   amLODipine (NORVASC) 2.5 MG tablet TAKE 1 TABLET BY MOUTH DAILY 90 tablet 3   buPROPion (WELLBUTRIN SR) 150 MG 12 hr tablet Take 1 tablet (150 mg total) by mouth 2 (two) times daily. 60 tablet 2   omeprazole (PRILOSEC) 40 MG capsule TAKE 1 CAPSULE BY MOUTH DAILY AS NEEDED FOR HEARTBURN ON AN EMPTY STOMACH 90 capsule 3   rosuvastatin (CRESTOR) 10 MG tablet Take 1 tablet (10 mg total) by mouth daily. 90 tablet 1   RYALTRIS 665-25 MCG/ACT SUSP SMARTSIG:1 Spray(s) Both Nares Every 12 Hours PRN     No facility-administered medications prior to visit.    Review of Systems  Review of Systems  Constitutional: Negative.   HENT: Negative.    Respiratory:  Negative for chest tightness, shortness of breath  and wheezing.   Cardiovascular: Negative.    Physical Exam  BP 126/76 (BP Location: Left Arm, Cuff Size: Normal)   Pulse 84   Temp 97.9 F (36.6 C) (Temporal)   Ht 5' 7.5" (1.715 m)   Wt 176 lb 9.6 oz (80.1 kg)   LMP 05/23/2015   SpO2 97%   BMI 27.25 kg/m  Physical Exam Constitutional:      Appearance: Normal appearance.  HENT:     Head: Normocephalic and atraumatic.     Mouth/Throat:     Mouth:  Mucous membranes are moist.     Pharynx: Oropharynx is clear.  Cardiovascular:     Rate and Rhythm: Normal rate and regular rhythm.  Pulmonary:     Effort: Pulmonary effort is normal.     Breath sounds: Normal breath sounds.  Skin:    General: Skin is warm and dry.  Neurological:     General: No focal deficit present.     Mental Status: She is alert and oriented to person, place, and time. Mental status is at baseline.  Psychiatric:        Mood and Affect: Mood normal.        Behavior: Behavior normal.        Thought Content: Thought content normal.        Judgment: Judgment normal.      Lab Results:  CBC    Component Value Date/Time   WBC 7.5 11/25/2022 1051   WBC 8.3 02/20/2019 1113   RBC 4.59 11/25/2022 1051   RBC 5.08 02/20/2019 1113   HGB 13.6 11/25/2022 1051   HCT 39.5 11/25/2022 1051   PLT 262 11/25/2022 1051   MCV 86 11/25/2022 1051   MCV 86 11/10/2014 1601   MCH 29.6 11/25/2022 1051   MCH 29.9 02/20/2019 1113   MCHC 34.4 11/25/2022 1051   MCHC 33.7 02/20/2019 1113   RDW 13.2 11/25/2022 1051   RDW 12.7 11/10/2014 1601   LYMPHSABS 1.7 11/25/2022 1051   LYMPHSABS 2.0 08/14/2014 0029   MONOABS 0.5 02/20/2019 1113   MONOABS 0.7 08/14/2014 0029   EOSABS 0.2 11/25/2022 1051   EOSABS 0.2 08/14/2014 0029   BASOSABS 0.0 11/25/2022 1051   BASOSABS 0.1 08/14/2014 0029    BMET    Component Value Date/Time   NA 138 11/25/2022 1051   NA 140 08/14/2014 0029   K 4.3 11/25/2022 1051   K 3.4 (L) 08/14/2014 0029   CL 99 11/25/2022 1051   CL 106 08/14/2014 0029   CO2 25 11/25/2022 1051   CO2 28 08/14/2014 0029   GLUCOSE 86 11/25/2022 1051   GLUCOSE 103 (H) 02/20/2019 1113   GLUCOSE 93 08/14/2014 0029   BUN 16 11/25/2022 1051   BUN 11 08/14/2014 0029   CREATININE 0.76 11/25/2022 1051   CREATININE 0.92 08/14/2014 0029   CALCIUM 9.8 11/25/2022 1051   CALCIUM 8.4 (L) 08/14/2014 0029   GFRNONAA 82 05/22/2020 1514   GFRNONAA >60 08/14/2014 0029   GFRAA 95  05/22/2020 1514   GFRAA >60 08/14/2014 0029    BNP No results found for: "BNP"  ProBNP No results found for: "PROBNP"  Imaging: No results found.   Assessment & Plan:   Stage 1 mild COPD by GOLD classification (HCC) - Stable; No daily respiratory symptoms. Minimal dyspnea with inclines. Intermittent cough due to allergen exposure - Not on daily maintenance inhaler, does not require SABA - Continue under observation - FU 1 year or sooner   Aortic aneurysm (HCC) -  CTA June 2024 showed grossly stable 4.2 cm ascending thoracic aortic aneurysm. 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 Quit smoking June 2024, has 1 cigarette a week - Continue Wellbutrin 150mg  twice daily    Glenford Bayley, NP 03/06/2023

## 2023-03-06 NOTE — Assessment & Plan Note (Signed)
-   CTA June 2024 showed grossly stable 4.2 cm ascending thoracic aortic aneurysm. Recommend annual imaging followup by CTA or MRA

## 2023-03-06 NOTE — Assessment & Plan Note (Signed)
-   Very occasional tobacco use. Quit smoking June 2024, has 1 cigarette a week - Continue Wellbutrin 150mg  twice daily

## 2023-03-06 NOTE — Assessment & Plan Note (Signed)
-   Stable mild biapical scarring, continue monitoring - Smoking cessation strongly encouraged

## 2023-03-06 NOTE — Patient Instructions (Addendum)
You looked well today Carmen Ross CTA chest showed stable scarring bilateral lung apices Continue annual CT chest with either CT angiogram or Low dose CT chest (lung cancer screening program) Most important thing is to maintain abstinence from cigarettes Continue wellbutrin twice daily as directed If you develop daily respiratory symptoms return to office sooner   Follow-up 1 year with Dr. Jayme Cloud or sooner if needed   Steps to Quit Smoking Smoking tobacco is the leading cause of preventable death. It can affect almost every organ in the body. Smoking puts you and people around you at risk for many serious, long-lasting (chronic) diseases. Quitting smoking can be hard, but it is one of the best things that you can do for your health. It is never too late to quit. Do not give up if you cannot quit the first time. Some people need to try many times to quit. Do your best to stick to your quit plan, and talk with your doctor if you have any questions or concerns. How do I get ready to quit? Pick a date to quit. Set a date within the next 2 weeks to give you time to prepare. Write down the reasons why you are quitting. Keep this list in places where you will see it often. Tell your family, friends, and co-workers that you are quitting. Their support is important. Talk with your doctor about the choices that may help you quit. Find out if your health insurance will pay for these treatments. Know the people, places, things, and activities that make you want to smoke (triggers). Avoid them. What first steps can I take to quit smoking? Throw away all cigarettes at home, at work, and in your car. Throw away the things that you use when you smoke, such as ashtrays and lighters. Clean your car. Empty the ashtray. Clean your home, including curtains and carpets. What can I do to help me quit smoking? Talk with your doctor about taking medicines and seeing a counselor. You are more likely to succeed when  you do both. If you are pregnant or breastfeeding: Talk with your doctor about counseling or other ways to quit smoking. Do not take medicine to help you quit smoking unless your doctor tells you to. Quit right away Quit smoking completely, instead of slowly cutting back on how much you smoke over a period of time. Stopping smoking right away may be more successful than slowly quitting. Go to counseling. In-person is best if this is an option. You are more likely to quit if you go to counseling sessions regularly. Take medicine You may take medicines to help you quit. Some medicines need a prescription, and some you can buy over-the-counter. Some medicines may contain a drug called nicotine to replace the nicotine in cigarettes. Medicines may: Help you stop having the desire to smoke (cravings). Help to stop the problems that come when you stop smoking (withdrawal symptoms). Your doctor may ask you to use: Nicotine patches, gum, or lozenges. Nicotine inhalers or sprays. Non-nicotine medicine that you take by mouth. Find resources Find resources and other ways to help you quit smoking and remain smoke-free after you quit. They include: Online chats with a Veterinary surgeon. Phone quitlines. Printed Materials engineer. Support groups or group counseling. Text messaging programs. Mobile phone apps. Use apps on your mobile phone or tablet that can help you stick to your quit plan. Examples of free services include Quit Guide from the CDC and smokefree.gov  What can I do to  make it easier to quit?  Talk to your family and friends. Ask them to support and encourage you. Call a phone quitline, such as 1-800-QUIT-NOW, reach out to support groups, or work with a Veterinary surgeon. Ask people who smoke to not smoke around you. Avoid places that make you want to smoke, such as: Bars. Parties. Smoke-break areas at work. Spend time with people who do not smoke. Lower the stress in your life. Stress can make  you want to smoke. Try these things to lower stress: Getting regular exercise. Doing deep-breathing exercises. Doing yoga. Meditating. What benefits will I see if I quit smoking? Over time, you may have: A better sense of smell and taste. Less coughing and sore throat. A slower heart rate. Lower blood pressure. Clearer skin. Better breathing. Fewer sick days. Summary Quitting smoking can be hard, but it is one of the best things that you can do for your health. Do not give up if you cannot quit the first time. Some people need to try many times to quit. When you decide to quit smoking, make a plan to help you succeed. Quit smoking right away, not slowly over a period of time. When you start quitting, get help and support to keep you smoke-free. This information is not intended to replace advice given to you by your health care provider. Make sure you discuss any questions you have with your health care provider. Document Revised: 07/16/2021 Document Reviewed: 07/16/2021 Elsevier Patient Education  2024 [employer].

## 2023-03-08 NOTE — Progress Notes (Signed)
Agree with the details of the visit as noted by Elizabeth Walsh, NP.  C. Laura Gonzalez, MD [facility] 

## 2023-04-01 ENCOUNTER — Other Ambulatory Visit: Payer: Self-pay | Admitting: Internal Medicine

## 2023-04-03 ENCOUNTER — Encounter: Payer: Self-pay | Admitting: Internal Medicine

## 2023-04-03 MED ORDER — BUPROPION HCL ER (SR) 150 MG PO TB12: 150.0000 mg | ORAL_TABLET | Freq: Two times a day (BID) | ORAL | 2 refills | Status: DC

## 2023-05-11 ENCOUNTER — Other Ambulatory Visit: Payer: Self-pay | Admitting: Internal Medicine

## 2023-05-17 ENCOUNTER — Ambulatory Visit: Payer: 59 | Admitting: Internal Medicine

## 2023-05-17 ENCOUNTER — Encounter: Payer: Self-pay | Admitting: Internal Medicine

## 2023-05-17 VITALS — BP 138/76 | HR 78 | Ht 67.5 in | Wt 180.6 lb

## 2023-05-17 DIAGNOSIS — I10 Essential (primary) hypertension: Secondary | ICD-10-CM

## 2023-05-17 DIAGNOSIS — Z0000 Encounter for general adult medical examination without abnormal findings: Secondary | ICD-10-CM | POA: Diagnosis not present

## 2023-05-17 DIAGNOSIS — Z23 Encounter for immunization: Secondary | ICD-10-CM | POA: Diagnosis not present

## 2023-05-17 DIAGNOSIS — F410 Panic disorder [episodic paroxysmal anxiety] without agoraphobia: Secondary | ICD-10-CM

## 2023-05-17 DIAGNOSIS — E785 Hyperlipidemia, unspecified: Secondary | ICD-10-CM

## 2023-05-17 DIAGNOSIS — Z720 Tobacco use: Secondary | ICD-10-CM

## 2023-05-17 DIAGNOSIS — I7121 Aneurysm of the ascending aorta, without rupture: Secondary | ICD-10-CM | POA: Diagnosis not present

## 2023-05-17 DIAGNOSIS — F411 Generalized anxiety disorder: Secondary | ICD-10-CM | POA: Diagnosis not present

## 2023-05-17 DIAGNOSIS — C569 Malignant neoplasm of unspecified ovary: Secondary | ICD-10-CM

## 2023-05-17 DIAGNOSIS — Z1231 Encounter for screening mammogram for malignant neoplasm of breast: Secondary | ICD-10-CM

## 2023-05-17 MED ORDER — ROSUVASTATIN CALCIUM 10 MG PO TABS: 10.0000 mg | ORAL_TABLET | Freq: Every day | ORAL | 1 refills | Status: DC

## 2023-05-17 MED ORDER — BUPROPION HCL ER (SR) 150 MG PO TB12: 150.0000 mg | ORAL_TABLET | Freq: Two times a day (BID) | ORAL | 1 refills | Status: DC

## 2023-05-17 MED ORDER — HYDROXYZINE PAMOATE 25 MG PO CAPS: 25.0000 mg | ORAL_CAPSULE | Freq: Three times a day (TID) | ORAL | 0 refills | Status: DC | PRN

## 2023-05-17 MED ORDER — ALPRAZOLAM 0.25 MG PO TABS: 0.2500 mg | ORAL_TABLET | Freq: Every day | ORAL | 5 refills | Status: DC

## 2023-05-17 NOTE — Patient Instructions (Addendum)
.  For occasional  insomnia:  I recommend  Relaxium f  (as seen n TV commercials)  available on [employer] . It contains:  Melatonin 5 mg  Chamomile 25 mcbc Passionflower extract 75 mg GABA 100 mg Ashwaganda extract 125 mg Magnesium citrate, glycinate, oxide (100 mg)  L tryptophan 500 mg Valerest (proprietary  ingredient ; probably valeria root extract)   For the "neurotic itching:"  Try the Hydroxyzine:  it's an old anthistamine that is effective and calming  Save the alprazolam for panic attacks,  or extreme emotions

## 2023-05-17 NOTE — Assessment & Plan Note (Signed)
S/p ophorectomy 2017.  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  Her last pelvic exam was Oct 2023, by me and a  PAP smear was done.

## 2023-05-17 NOTE — Progress Notes (Unsigned)
Patient ID: Carmen Ross, female    DOB: [birth year]  Age: 62 y.o. MRN: [id number]  The patient is here for annual preventive examination and management of other chronic and acute problems.   The risk factors are reflected in the social history.   The roster of all physicians providing medical care to patient - is listed in the Snapshot section of the chart.   Activities of daily living:  The patient is 100% independent in all ADLs: dressing, toileting, feeding as well as independent mobility   Home safety : The patient has smoke detectors in the home. They wear seatbelts.  There are no unsecured firearms at home. There is no violence in the home.    There is no risks for hepatitis, STDs or HIV. There is no   history of blood transfusion. They have no travel history to infectious disease endemic areas of the world.   The patient has seen their dentist in the last six month. They have seen their eye doctor in the last year. The patinet  denies slight hearing difficulty with regard to whispered voices and some television programs.  They have deferred audiologic testing in the last year.  They do not  have excessive sun exposure. Discussed the need for sun protection: hats, long sleeves and use of sunscreen if there is significant sun exposure.    Diet: the importance of a healthy diet is discussed. They do have a healthy diet.   The benefits of regular aerobic exercise were discussed. The patient  exercises  3 to 5 days per week  for  60 minutes.    Depression screen: there are no signs or vegative symptoms of depression- irritability, change in appetite, anhedonia, sadness/tearfullness.   The following portions of the patient's history were reviewed and updated as appropriate: allergies, current medications, past family history, past medical history,  past surgical history, past social history  and problem list.   Visual acuity was not assessed per patient preference since the patient has  regular follow up with an  ophthalmologist. Hearing and body mass index were assessed and reviewed.    During the course of the visit the patient was educated and counseled about appropriate screening and preventive services including : fall prevention , diabetes screening, nutrition counseling, colorectal cancer screening, and recommended immunizations.    Chief Complaint:  GAD:  husband diagnosed with bladder CA recently; his coping method was too :prepre for the worst" and he has been abusing alcohol which has created conflict. Mother is 49 and becomes moody and nsufferable , lives alone by choice,  no siblings other than her to help take care of her nd patient has had increase in dail y symptoms and requesting increased qty of alprazolam to manage symptoms.   BP was 122/7 to home  yesterday and last week at ANT   Smoking less:  1 pack lasts 2 weeks.  Using wellbutrin   Review of Symptoms  Patient denies headache, fevers, malaise, unintentional weight loss, skin rash, eye pain, sinus congestion and sinus pain, sore throat, dysphagia,  hemoptysis , cough, dyspnea, wheezing, chest pain, palpitations, orthopnea, edema, abdominal pain, nausea, melena, diarrhea, constipation, flank pain, dysuria, hematuria, urinary  Frequency, nocturia, numbness, tingling, seizures,  Focal weakness, Loss of consciousness,  Tremor, insomnia, depression, anxiety, and suicidal ideation.    Physical Exam:  BP 138/76   Pulse 78   Ht 5' 7.5" (1.715 m)   Wt 180 lb 9.6 oz (81.9 kg)   LMP  05/23/2015   SpO2 95%   BMI 27.87 kg/m    Physical Exam Vitals reviewed.  Constitutional:      General: She is not in acute distress.    Appearance: Normal appearance. She is well-developed and normal weight. She is not ill-appearing, toxic-appearing or diaphoretic.  HENT:     Head: Normocephalic.     Right Ear: Tympanic membrane, ear canal and external ear normal. There is no impacted cerumen.     Left Ear: Tympanic  membrane, ear canal and external ear normal. There is no impacted cerumen.     Nose: Nose normal.     Mouth/Throat:     Mouth: Mucous membranes are moist.     Pharynx: Oropharynx is clear.  Eyes:     General: No scleral icterus.       Right eye: No discharge.        Left eye: No discharge.     Conjunctiva/sclera: Conjunctivae normal.     Pupils: Pupils are equal, round, and reactive to light.  Neck:     Thyroid: No thyromegaly.     Vascular: No carotid bruit or JVD.  Cardiovascular:     Rate and Rhythm: Normal rate and regular rhythm.     Heart sounds: Normal heart sounds.  Pulmonary:     Effort: Pulmonary effort is normal. No respiratory distress.     Breath sounds: Normal breath sounds.  Chest:  Breasts:    Breasts are symmetrical.     Right: Normal. No swelling, inverted nipple, mass, nipple discharge, skin change or tenderness.     Left: Normal. No swelling, inverted nipple, mass, nipple discharge, skin change or tenderness.  Abdominal:     General: Bowel sounds are normal.     Palpations: Abdomen is soft. There is no mass.     Tenderness: There is no abdominal tenderness. There is no guarding or rebound.  Musculoskeletal:        General: Normal range of motion.     Cervical back: Normal range of motion and neck supple.  Lymphadenopathy:     Cervical: No cervical adenopathy.     Upper Body:     Right upper body: No supraclavicular, axillary or pectoral adenopathy.     Left upper body: No supraclavicular, axillary or pectoral adenopathy.  Skin:    General: Skin is warm and dry.  Neurological:     General: No focal deficit present.     Mental Status: She is alert and oriented to person, place, and time. Mental status is at baseline.  Psychiatric:        Mood and Affect: Mood normal.        Behavior: Behavior normal.        Thought Content: Thought content normal.        Judgment: Judgment normal.     Assessment and Plan: Encounter for screening mammogram for  malignant neoplasm of breast    No follow-ups on file.  Sherlene Shams, MD

## 2023-05-18 NOTE — Assessment & Plan Note (Signed)

## 2023-05-18 NOTE — Assessment & Plan Note (Signed)
Improved with rare prn use of alprazolam and control of blood pressure.  She has deferred SSRI therapy

## 2023-05-18 NOTE — Assessment & Plan Note (Signed)
She has reduced her smoking but not quit completely and is using wellbutrin once daily dosing

## 2023-06-07 ENCOUNTER — Encounter: Payer: Self-pay | Admitting: Internal Medicine

## 2023-06-07 MED ORDER — ROSUVASTATIN CALCIUM 10 MG PO TABS: 10.0000 mg | ORAL_TABLET | Freq: Every day | ORAL | 1 refills | Status: DC

## 2023-06-07 NOTE — Telephone Encounter (Signed)
Crestor has been sent to [facility] as requested.

## 2023-06-08 MED ORDER — ROSUVASTATIN CALCIUM 10 MG PO TABS: 10.0000 mg | ORAL_TABLET | Freq: Every day | ORAL | 1 refills | Status: DC

## 2023-06-08 NOTE — Addendum Note (Signed)
Addended by: Sherlene Shams on: 06/08/2023 12:51 PM   Modules accepted: Orders

## 2023-06-17 ENCOUNTER — Other Ambulatory Visit: Payer: Self-pay | Admitting: Internal Medicine

## 2023-06-19 ENCOUNTER — Other Ambulatory Visit: Payer: Self-pay | Admitting: Internal Medicine

## 2023-06-19 NOTE — Telephone Encounter (Signed)
Duplicate refill request. Is it okay to refuse?

## 2023-07-17 ENCOUNTER — Ambulatory Visit
Admission: RE | Admit: 2023-07-17 | Discharge: 2023-07-17 | Disposition: A | Discharge status: Home / Self Care | Payer: 59 | Source: Ambulatory Visit | Attending: Internal Medicine | Admitting: Internal Medicine

## 2023-07-17 DIAGNOSIS — Z1231 Encounter for screening mammogram for malignant neoplasm of breast: Secondary | ICD-10-CM | POA: Diagnosis present

## 2023-07-20 LAB — CBC WITH DIFFERENTIAL/PLATELET
Basophils Absolute: 0 10*3/uL (ref 0.0–0.2)
Basos: 1 %
EOS (ABSOLUTE): 0.3 10*3/uL (ref 0.0–0.4)
Eos: 4 %
Hematocrit: 40.4 % (ref 34.0–46.6)
Hemoglobin: 13.4 g/dL (ref 11.1–15.9)
Immature Grans (Abs): 0 10*3/uL (ref 0.0–0.1)
Immature Granulocytes: 0 %
Lymphocytes Absolute: 2 10*3/uL (ref 0.7–3.1)
Lymphs: 26 %
MCH: 28.7 pg (ref 26.6–33.0)
MCHC: 33.2 g/dL (ref 31.5–35.7)
MCV: 87 fL (ref 79–97)
Monocytes Absolute: 0.6 10*3/uL (ref 0.1–0.9)
Monocytes: 8 %
Neutrophils Absolute: 4.9 10*3/uL (ref 1.4–7.0)
Neutrophils: 61 %
Platelets: 242 10*3/uL (ref 150–450)
RBC: 4.67 x10E6/uL (ref 3.77–5.28)
RDW: 12.4 % (ref 11.7–15.4)
WBC: 7.9 10*3/uL (ref 3.4–10.8)

## 2023-07-20 LAB — COMPREHENSIVE METABOLIC PANEL
ALT: 17 [IU]/L (ref 0–32)
AST: 20 [IU]/L (ref 0–40)
Albumin: 4.5 g/dL (ref 3.9–4.9)
Alkaline Phosphatase: 89 [IU]/L (ref 44–121)
BUN/Creatinine Ratio: 14 (ref 12–28)
BUN: 13 mg/dL (ref 8–27)
Bilirubin Total: 0.5 mg/dL (ref 0.0–1.2)
CO2: 23 mmol/L (ref 20–29)
Calcium: 9.7 mg/dL (ref 8.7–10.3)
Chloride: 100 mmol/L (ref 96–106)
Creatinine, Ser: 0.95 mg/dL (ref 0.57–1.00)
Globulin, Total: 2.4 g/dL (ref 1.5–4.5)
Glucose: 114 mg/dL — ABNORMAL HIGH (ref 70–99)
Potassium: 4.3 mmol/L (ref 3.5–5.2)
Sodium: 138 mmol/L (ref 134–144)
Total Protein: 6.9 g/dL (ref 6.0–8.5)
eGFR: 68 mL/min/{1.73_m2} (ref >59)

## 2023-10-10 ENCOUNTER — Other Ambulatory Visit: Payer: Self-pay | Admitting: Internal Medicine

## 2023-12-05 ENCOUNTER — Other Ambulatory Visit: Payer: Self-pay | Admitting: Internal Medicine

## 2023-12-05 DIAGNOSIS — B9681 Helicobacter pylori [H. pylori] as the cause of diseases classified elsewhere: Secondary | ICD-10-CM

## 2023-12-09 ENCOUNTER — Other Ambulatory Visit: Payer: Self-pay | Admitting: Internal Medicine

## 2023-12-12 NOTE — Telephone Encounter (Signed)
 Refilled: 05/17/2023 Last OV: 05/17/2023 Next OV: 05/20/2024

## 2023-12-25 ENCOUNTER — Other Ambulatory Visit: Payer: Self-pay | Admitting: Thoracic Surgery (Cardiothoracic Vascular Surgery)

## 2023-12-25 DIAGNOSIS — I7121 Aneurysm of the ascending aorta, without rupture: Secondary | ICD-10-CM

## 2023-12-27 ENCOUNTER — Other Ambulatory Visit: Payer: Self-pay | Admitting: Thoracic Surgery (Cardiothoracic Vascular Surgery)

## 2023-12-27 DIAGNOSIS — I7121 Aneurysm of the ascending aorta, without rupture: Secondary | ICD-10-CM

## 2024-01-29 ENCOUNTER — Ambulatory Visit
Admission: RE | Admit: 2024-01-29 | Discharge: 2024-01-29 | Disposition: A | Discharge status: Home / Self Care | Source: Ambulatory Visit | Attending: Thoracic Surgery (Cardiothoracic Vascular Surgery) | Admitting: Thoracic Surgery (Cardiothoracic Vascular Surgery)

## 2024-01-29 DIAGNOSIS — I7121 Aneurysm of the ascending aorta, without rupture: Secondary | ICD-10-CM | POA: Diagnosis present

## 2024-01-29 MED ORDER — IOHEXOL 350 MG/ML SOLN
75.0000 mL | Freq: Once | INTRAVENOUS | Status: AC | PRN
Start: 2024-01-29 — End: 2024-01-29
  Administered 2024-01-29: 75 mL via INTRAVENOUS

## 2024-01-31 ENCOUNTER — Other Ambulatory Visit (HOSPITAL_COMMUNITY)

## 2024-02-06 ENCOUNTER — Ambulatory Visit: Admitting: Pulmonary Disease

## 2024-02-07 ENCOUNTER — Ambulatory Visit

## 2024-02-13 ENCOUNTER — Ambulatory Visit

## 2024-02-14 ENCOUNTER — Ambulatory Visit: Admitting: Pulmonary Disease

## 2024-02-14 ENCOUNTER — Encounter: Payer: Self-pay | Admitting: Pulmonary Disease

## 2024-02-14 VITALS — BP 140/80 | HR 73 | Temp 98.3°F | Ht 67.5 in | Wt 177.0 lb

## 2024-02-14 DIAGNOSIS — F17210 Nicotine dependence, cigarettes, uncomplicated: Secondary | ICD-10-CM

## 2024-02-14 DIAGNOSIS — J84115 Respiratory bronchiolitis interstitial lung disease: Secondary | ICD-10-CM | POA: Diagnosis not present

## 2024-02-14 DIAGNOSIS — J449 Chronic obstructive pulmonary disease, unspecified: Secondary | ICD-10-CM

## 2024-02-14 NOTE — Progress Notes (Signed)
 Subjective:    Patient ID: Carmen Ross, female    DOB: 11/17/1960, 63 y.o.   MRN: [id number]  Patient Care Team: Marylynn Verneita CROME, MD as PCP - General (Internal Medicine) Kerrin Elspeth BROCKS, MD as Consulting Physician (Cardiothoracic Surgery) Tamea Dedra CROME, MD as Consulting Physician (Pulmonary Disease)  Chief Complaint  Patient presents with   Follow-up    BACKGROUND/INTERVAL: Carmen Ross is a 64 year old current smoker with a 40-pack-year history of smoking and a history as noted below, who presents for follow-up on previously noted abnormalities on lung cancer screening CT noted to be ultimately pleural-parenchymal scarring.  She is followed yearly with CT angio due to ascending thoracic aortic aneurysm.  Therefore, her lung cancer screening has been combined with her aneurysm monitoring.  She has stage I COPD which is asymptomatic.  She was last seen on 06 March 2023 by Landry Ferrari, NP.  This is a scheduled follow-up visit.  HPI Discussed the use of AI scribe software for clinical note transcription with the patient, who gave verbal consent to proceed.  History of Present Illness   Carmen Ross is a 63 year old female with stage one COPD who presents for follow-up of previously noted abnormalities on chest CT.  She has stage one COPD and previously noted pleuro-parenchymal scarring on chest CT with punctate nodular densities at the lung apices.findings are likely due to RB ILD.  There are no current problems with breathing, and she has not needed inhalers recently.  She has experienced a recent lapse in smoking cessation, resuming smoking due to stress. She currently smokes about five cigarettes a day but is actively trying to reduce this number by engaging in alternative activities such as chewing gum and running. Her goal is to be completely smoke-free by August.  She is asymptomatic from the COPD standpoint.  Weight has been stable.  Appetite good.  No fevers, chills or  sweats.  No cough or sputum production.  Overall she feels well and looks well.  She is not enrolled in lung cancer screening program as she gets yearly CTs monitoring for her thoracic aneurysm.    Review of Systems A 10 point review of systems was performed and it is as noted above otherwise negative.   Patient Active Problem List   Diagnosis Date Noted   Respiratory bronchiolitis associated interstitial lung disease (HCC) 03/06/2023   Urgency-frequency syndrome 03/05/2023   Bladder spasms 03/05/2023   Aortic atherosclerosis (HCC) 12/25/2022   Stage 1 mild COPD by GOLD classification (HCC) 11/27/2022   Laryngeal cyst 11/27/2022   Hyperlipidemia LDL goal <100 11/27/2022   COVID-19 virus infection 09/26/2022   Aortic aneurysm (HCC) 05/26/2022   Ovarian cancer (HCC) 10/12/2021   Colon cancer screening 05/24/2021   Generalized anxiety disorder with panic attacks 06/10/2020   Essential hypertension 05/23/2020   Tobacco abuse counseling 07/05/2013   Tobacco abuse 07/05/2013   Visit for preventive health examination 03/28/2013    Social History   Tobacco Use   Smoking status: Every Day    Current packs/day: 0.25    Average packs/day: 1 pack/day for 41.1 years (40.8 ttl pk-yrs)    Types: Cigarettes    Start date: 03/27/1982    Last attempt to quit: 12/21/2022   Smokeless tobacco: Never  Substance Use Topics   Alcohol use: Yes    Comment: occasional very rare glass of wine with holiday, 5-6 per year    Allergies  Allergen Reactions   Tape Rash  Current Meds  Medication Sig   ALPRAZolam  (XANAX ) 0.25 MG tablet TAKE 1 TABLET(0.25 MG) BY MOUTH DAILY AS NEEDED FOR PANIC ATTACKS (Patient taking differently: Take 0.25 mg by mouth as needed.)   amLODipine  (NORVASC ) 2.5 MG tablet TAKE 1 TABLET BY MOUTH DAILY   buPROPion  (WELLBUTRIN  SR) 150 MG 12 hr tablet Take 1 tablet (150 mg total) by mouth 2 (two) times daily.   hydrOXYzine  (VISTARIL ) 25 MG capsule Take 1 capsule (25 mg total)  by mouth every 8 (eight) hours as needed for anxiety or itching.   omeprazole  (PRILOSEC) 40 MG capsule TAKE 1 CAPSULE BY MOUTH DAILY AS NEEDED FOR HEARTBURN ON AN EMPTY STOMACH   rosuvastatin  (CRESTOR ) 10 MG tablet TAKE 1 TABLET BY MOUTH DAILY   RYALTRIS 665-25 MCG/ACT SUSP SMARTSIG:1 Spray(s) Both Nares Every 12 Hours PRN    Immunization History  Administered Date(s) Administered   Influenza Split 05/12/2014, 05/08/2018   Influenza, Seasonal, Injecte, Preservative Fre 05/17/2023   Influenza,inj,Quad PF,6+ Mos 05/24/2021, 05/24/2022   Influenza-Unspecified 05/13/2015, 05/06/2020, 05/14/2020   Moderna SARS-COV2 Booster Vaccination 05/28/2021   Moderna Sars-Covid-2 Vaccination 10/04/2019, 11/01/2019, 06/04/2020   Tdap 05/22/2020   Zoster Recombinant(Shingrix ) 05/26/2022, 11/25/2022        Objective:     BP (!) 140/100 (BP Location: Left Arm, Patient Position: Sitting, Cuff Size: Normal)   Pulse 73   Temp 98.3 F (36.8 C) (Oral)   Ht 5' 7.5 (1.715 m)   Wt 177 lb (80.3 kg)   LMP 05/23/2015   SpO2 98%   BMI 27.31 kg/m   SpO2: 98 %  GENERAL: Well-developed, well-nourished woman, fit appearing, fully ambulatory. HEAD: Normocephalic, atraumatic.  EYES: Pupils equal, round, reactive to light.  No scleral icterus.  MOUTH: Dentition intact.  Oral mucosa moist.  No thrush. NECK: Supple. No thyromegaly. Trachea midline. No JVD.  No adenopathy. PULMONARY: Good air entry bilaterally.  No adventitious sounds. CARDIOVASCULAR: S1 and S2. Regular rate and rhythm.  No rubs, murmurs or gallops heard. ABDOMEN: Benign. MUSCULOSKELETAL: No joint deformity, no clubbing, no edema.  NEUROLOGIC: No overt focal deficit, no gait disturbance, speech is fluent. SKIN: Intact,warm,dry. PSYCH: Mood and behavior normal.      Assessment & Plan:     ICD-10-CM   1. Stage 1 mild COPD by GOLD classification (HCC)  J44.9     2. Respiratory bronchiolitis associated interstitial lung disease (HCC)   J84.115     3. Tobacco dependence due to cigarettes  F17.210      Assessment and Plan    Stage 1 COPD Stage 1 COPD with no current respiratory symptoms. She has resumed smoking, currently at five cigarettes per day, but aims to be smoke-free by August. No inhalers required at this time. - Encourage smoking cessation with a goal to be smoke-free by August. - Advise using alternative activities like chewing gum or running to manage cravings.  Benign pleuroparenchymal scarring Benign pleuroparenchymal scarring on previous chest CT, likely due to prior infection or inflammation. No current symptoms related to scarring. - Reassure about the benign nature of the scarring.   RB ILD/punctate nodularity on CT -Counsel regards to discontinuation of smoking which is treatment of choice for this condition  Tobacco dependence due to cigarettes Patient counseled regards to discontinuation of smoking total counseling time 3 to 5 minutes.    Smoking cessation instruction/counseling given:  counseled patient on the dangers of tobacco use, advised patient to stop smoking, and reviewed strategies to maximize success.  Advised if symptoms  do not improve or worsen, to please contact office for sooner follow up or seek emergency care.    I spent 32 minutes of dedicated to the care of this patient on the date of this encounter to include pre-visit review of records, face-to-face time with the patient discussing conditions above, post visit ordering of testing, clinical documentation with the electronic health record, making appropriate referrals as documented, and communicating necessary findings to members of the patients care team.     C. Leita Sanders, MD Advanced Bronchoscopy PCCM [facility]    *This note was generated using voice recognition software/Dragon and/or AI transcription program.  Despite best efforts to proofread, errors can occur which can change the meaning. Any  transcriptional errors that result from this process are unintentional and may not be fully corrected at the time of dictation.

## 2024-02-14 NOTE — Patient Instructions (Signed)
 VISIT SUMMARY:  Today, we discussed your stage one COPD and the previously noted scarring on your chest CT. You are currently not experiencing any breathing problems and have not needed to use inhalers. We also talked about your recent lapse in smoking cessation and your efforts to quit smoking again.  YOUR PLAN:  -STAGE 1 COPD: Stage 1 COPD is a mild form of chronic obstructive pulmonary disease, which affects your lungs and breathing. You are not currently experiencing any symptoms and do not need inhalers at this time. It is important to continue working towards quitting smoking, with a goal to be smoke-free by August. Using alternative activities like chewing gum or running can help manage cravings.  -BENIGN PLEUROPARENCHYMAL SCARRING: Benign pleuroparenchymal scarring is harmless scarring in the lung tissue, likely due to a past infection or inflammation. You do not have any symptoms related to this scarring, and it is not a cause for concern.  Imaging of the lung has not shown any change in this process.  INSTRUCTIONS:  Please continue to work on quitting smoking with the goal of being smoke-free by August. Use alternative activities like chewing gum or running to help manage cravings. No follow-up is needed for the scarring as it is benign and not causing any symptoms.

## 2024-02-26 NOTE — Progress Notes (Signed)
 [address] [place] [address]             [phone]            Carmen Ross [id number] 1960/08/13   History of Present Illness:  [name] is a 63 year old female with medical history of hypertension, COPD, ovarian cancer, and hyperlipidemia who presents for continued survellince of ascending thoracic aortic aneurysm. She has been followed annually since 2023.  CTA of chest on 01/2024 measured ascending thoracic aortic aneurysm at 4.3 cm.  She is active and exercises with walking.  Her blood pressure is slightly elevated at todays visit.  She states that she has been to other healthcare visits and has been getting higher readings at these visits as well.  She has not been checking her blood pressure at home. She denies chest pain, shortness of breath, and lower leg swelling.  She continues to smoke roughly 1/4 pack a day.  She is attempt to quit currently.      Current Outpatient Medications on File Prior to Visit  Medication Sig Dispense Refill   ALPRAZolam  (XANAX ) 0.25 MG tablet TAKE 1 TABLET(0.25 MG) BY MOUTH DAILY AS NEEDED FOR PANIC ATTACKS (Patient taking differently: Take 0.25 mg by mouth as needed.) 20 tablet 5   amLODipine  (NORVASC ) 2.5 MG tablet TAKE 1 TABLET BY MOUTH DAILY 90 tablet 3   buPROPion  (WELLBUTRIN  SR) 150 MG 12 hr tablet Take 1 tablet (150 mg total) by mouth 2 (two) times daily. 180 tablet 1   hydrOXYzine  (VISTARIL ) 25 MG capsule Take 1 capsule (25 mg total) by mouth every 8 (eight) hours as needed for anxiety or itching. 30 capsule 0   omeprazole  (PRILOSEC) 40 MG capsule TAKE 1 CAPSULE BY MOUTH DAILY AS NEEDED FOR HEARTBURN ON AN EMPTY STOMACH 90 capsule 2   rosuvastatin  (CRESTOR ) 10 MG tablet TAKE 1 TABLET BY MOUTH DAILY 90 tablet 3   RYALTRIS 665-25 MCG/ACT SUSP SMARTSIG:1 Spray(s) Both Nares Every 12 Hours PRN     No current facility-administered medications on file prior to visit.     ROS: Review of Systems   Constitutional: Negative.  Negative for chills and fever.  Respiratory: Negative.  Negative for cough and shortness of breath.   Cardiovascular:  Negative for chest pain and leg swelling.     BP (!) 141/84   Pulse 82   Resp 18   Ht 5' 7 (1.702 m)   Wt 178 lb (80.7 kg)   LMP 05/23/2015   SpO2 94%   BMI 27.88 kg/m   Physical Exam Constitutional:      Appearance: Normal appearance.  HENT:     Head: Normocephalic and atraumatic.  Cardiovascular:     Rate and Rhythm: Normal rate and regular rhythm.     Heart sounds: Normal heart sounds, S1 normal and S2 normal.  Pulmonary:     Effort: Pulmonary effort is normal.     Breath sounds: Normal breath sounds.  Musculoskeletal:     Cervical back: Normal range of motion.  Skin:    General: Skin is warm and dry.  Neurological:     General: No focal deficit present.     Mental Status: She is alert.        Imaging:  CLINICAL DATA:  Aneurysm of ascending aorta without rupture.   EXAM: CT ANGIOGRAPHY CHEST WITH CONTRAST   TECHNIQUE: Multidetector CT imaging of the chest  was performed using the standard protocol during bolus administration of intravenous contrast. Multiplanar CT image reconstructions and MIPs were obtained to evaluate the vascular anatomy.   RADIATION DOSE REDUCTION: This exam was performed according to the departmental dose-optimization program which includes automated exposure control, adjustment of the mA and/or kV according to patient size and/or use of iterative reconstruction technique.   CONTRAST:  75mL OMNIPAQUE  IOHEXOL  350 MG/ML SOLN   COMPARISON:  Chest CTA 01/24/2023   FINDINGS: Cardiovascular: Aortic root roughly measures 3.9 cm. Fusiform aneurysm of the ascending thoracic aorta measuring 4.3 cm and previously measured 4.2 cm. Bovine type arch. Great vessels are patent. Proximal vertebral arteries are patent. Proximal descending thoracic aorta measures 3.2 cm and stable. Negative for an  aortic dissection. Minimal atherosclerotic disease in the thoracic aorta. Normal caliber of the proximal abdominal aorta. Heart size is normal. No significant pericardial fluid.   Mediastinum/Nodes: No mediastinal or hilar lymph node enlargement. No axillary lymph node enlargement.   Lungs/Pleura: Trachea and mainstem bronchi are patent. Stable scarring at both lung apices. Both lungs are clear without airspace disease or consolidation. No pleural effusions. Punctate nodular densities at the lung apices are stable.   Upper Abdomen: Images of the upper abdomen are unremarkable.   Musculoskeletal: No acute bone abnormality.   Review of the MIP images confirms the above findings.   IMPRESSION: 1. Fusiform aneurysm of the ascending thoracic aorta measuring 4.3 cm and previously measured 4.2 cm. Recommend annual imaging followup by CTA or MRA. This recommendation follows 2010 ACCF/AHA/AATS/ACR/ASA/SCA/SCAI/SIR/STS/SVM Guidelines for the Diagnosis and Management of Patients with Thoracic Aortic Disease. Circulation. 2010; 121: [id number]. Aortic aneurysm NOS (ICD10-I71.9) 2. No acute lung findings. 3. Aortic Atherosclerosis (ICD10-I70.0).     Electronically Signed   By: Juliene Balder M.D.   On: 02/05/2024 08:53    A/P: Aneurysm of ascending aorta without rupture (HCC) - Fusiform aneurysm of the ascending thoracic aorta measuring 4.3cm on CTA of the chest.  Echocardiogram from 08/2014 showed normal structure of the aortic valve. We discussed the natural history and and risk factors for growth of ascending aortic aneurysms. Discussed recommendations to minimize the risk of further expansion or dissection including careful blood pressure control, avoidance of contact sports and heavy lifting, attention to lipid management.  We covered the importance of smoking cessation.  The patient does not yet meet surgical criteria of >5.5cm. The patient is aware of signs and symptoms of aortic  dissection and when to present to the emergency department.   Follow up CTA of chest in one year    Risk Modification:  Statin:  rosuvastatin  10 mg   Smoking cessation instruction/counseling given:  counseled patient on the dangers of tobacco use, advised patient to stop smoking, and reviewed strategies to maximize success  Patient was counseled on importance of Blood Pressure Control.  She is going to start checking home readings and she should contact her Primary Care Physician if they start to have blood pressure readings over 130s/90s. Do not ever stop blood pressure medications on your own, unless instructed by healthcare professional.  Please avoid use of Fluoroquinolones as this can potentially increase your risk of Aortic Rupture and/or Dissection  Patient educated on signs and symptoms of Aortic Dissection, handout also provided in AVS  Manuelita CHRISTELLA Rough, PA-C 02/27/24

## 2024-02-27 ENCOUNTER — Ambulatory Visit: Attending: Thoracic Surgery (Cardiothoracic Vascular Surgery)

## 2024-02-27 VITALS — BP 141/84 | HR 82 | Resp 18 | Ht 67.0 in | Wt 178.0 lb

## 2024-02-27 DIAGNOSIS — I7121 Aneurysm of the ascending aorta, without rupture: Secondary | ICD-10-CM | POA: Diagnosis not present

## 2024-02-27 NOTE — Patient Instructions (Signed)

## 2024-03-05 ENCOUNTER — Other Ambulatory Visit: Payer: Self-pay | Admitting: *Deleted

## 2024-03-05 DIAGNOSIS — F17210 Nicotine dependence, cigarettes, uncomplicated: Secondary | ICD-10-CM

## 2024-03-05 DIAGNOSIS — Z122 Encounter for screening for malignant neoplasm of respiratory organs: Secondary | ICD-10-CM

## 2024-03-05 DIAGNOSIS — Z87891 Personal history of nicotine dependence: Secondary | ICD-10-CM

## 2024-03-11 ENCOUNTER — Other Ambulatory Visit: Payer: Self-pay | Admitting: Internal Medicine

## 2024-05-20 ENCOUNTER — Ambulatory Visit (INDEPENDENT_AMBULATORY_CARE_PROVIDER_SITE_OTHER): Payer: 59 | Admitting: Internal Medicine

## 2024-05-20 VITALS — BP 125/75 | HR 70 | Ht 67.0 in | Wt 178.8 lb

## 2024-05-20 DIAGNOSIS — F411 Generalized anxiety disorder: Secondary | ICD-10-CM | POA: Diagnosis not present

## 2024-05-20 DIAGNOSIS — J84115 Respiratory bronchiolitis interstitial lung disease: Secondary | ICD-10-CM

## 2024-05-20 DIAGNOSIS — I10 Essential (primary) hypertension: Secondary | ICD-10-CM | POA: Diagnosis not present

## 2024-05-20 DIAGNOSIS — I7121 Aneurysm of the ascending aorta, without rupture: Secondary | ICD-10-CM

## 2024-05-20 DIAGNOSIS — Z0001 Encounter for general adult medical examination with abnormal findings: Secondary | ICD-10-CM

## 2024-05-20 DIAGNOSIS — R7301 Impaired fasting glucose: Secondary | ICD-10-CM

## 2024-05-20 DIAGNOSIS — Z0000 Encounter for general adult medical examination without abnormal findings: Secondary | ICD-10-CM

## 2024-05-20 DIAGNOSIS — E785 Hyperlipidemia, unspecified: Secondary | ICD-10-CM | POA: Diagnosis not present

## 2024-05-20 DIAGNOSIS — K2970 Gastritis, unspecified, without bleeding: Secondary | ICD-10-CM

## 2024-05-20 DIAGNOSIS — Z23 Encounter for immunization: Secondary | ICD-10-CM

## 2024-05-20 DIAGNOSIS — B9681 Helicobacter pylori [H. pylori] as the cause of diseases classified elsewhere: Secondary | ICD-10-CM

## 2024-05-20 DIAGNOSIS — J449 Chronic obstructive pulmonary disease, unspecified: Secondary | ICD-10-CM | POA: Diagnosis not present

## 2024-05-20 DIAGNOSIS — J387 Other diseases of larynx: Secondary | ICD-10-CM

## 2024-05-20 DIAGNOSIS — Z1231 Encounter for screening mammogram for malignant neoplasm of breast: Secondary | ICD-10-CM

## 2024-05-20 DIAGNOSIS — R5383 Other fatigue: Secondary | ICD-10-CM | POA: Diagnosis not present

## 2024-05-20 DIAGNOSIS — F410 Panic disorder [episodic paroxysmal anxiety] without agoraphobia: Secondary | ICD-10-CM

## 2024-05-20 DIAGNOSIS — Z720 Tobacco use: Secondary | ICD-10-CM

## 2024-05-20 MED ORDER — METOPROLOL SUCCINATE ER 25 MG PO TB24: 25.0000 mg | ORAL_TABLET | Freq: Every day | ORAL | 3 refills | Status: AC

## 2024-05-20 MED ORDER — BUPROPION HCL ER (SR) 150 MG PO TB12: 150.0000 mg | ORAL_TABLET | Freq: Two times a day (BID) | ORAL | 1 refills | Status: AC

## 2024-05-20 MED ORDER — OMEPRAZOLE 40 MG PO CPDR: DELAYED_RELEASE_CAPSULE | ORAL | 2 refills | Status: AC

## 2024-05-20 NOTE — Patient Instructions (Signed)
 YOUR MAMMOGRAM Ihas been ordered , PLEASE CALL AND GET THIS SCHEDULED! [facility] - call [phone]  Veronda does  the scheduling for [facility] as well       I am adding a low dose of metoprolol  (25 mg ) for blood pressure . Take it at night   continue the amlodipine  in the morning  Goal is to keep BP 120/70 or less

## 2024-05-20 NOTE — Progress Notes (Unsigned)
 Patient ID: Carmen Ross, female    DOB: 1961/01/04  Age: 63 y.o. MRN: [id number]  The patient is here for annual preventive examination and management of other chronic and acute problems.   The risk factors are reflected in the social history.   The roster of all physicians providing medical care to patient - is listed in the Snapshot section of the chart.   Activities of daily living:  The patient is 100% independent in all ADLs: dressing, toileting, feeding as well as independent mobility   Home safety : The patient has smoke detectors in the home. They wear seatbelts.  There are no unsecured firearms at home. There is no violence in the home.    There is no risks for hepatitis, STDs or HIV. There is no   history of blood transfusion. They have no travel history to infectious disease endemic areas of the world.   The patient has seen their dentist in the last six month. They have seen their eye doctor in the last year. The patient  denies slight hearing difficulty with regard to whispered voices and some television programs.  They have deferred audiologic testing in the last year.  They do not  have excessive sun exposure. Discussed the need for sun protection: hats, long sleeves and use of sunscreen if there is significant sun exposure.    Diet: the importance of a healthy diet is discussed. They do have a healthy diet.   The benefits of regular aerobic exercise were discussed. The patient  exercises  3 to 5 days per week  for  60 minutes.    Depression screen: there are no signs or vegative symptoms of depression- irritability, change in appetite, anhedonia, sadness/tearfullness.   The following portions of the patient's history were reviewed and updated as appropriate: allergies, current medications, past family history, past medical history,  past surgical history, past social history  and problem list.   Visual acuity was not assessed per patient preference since the patient has  regular follow up with an  ophthalmologist. Hearing and body mass index were assessed and reviewed.    During the course of the visit the patient was educated and counseled about appropriate screening and preventive services including : fall prevention , diabetes screening, nutrition counseling, colorectal cancer screening, and recommended immunizations.    Chief Complaint:  Hypertension: patient checks blood pressure twice weekly at home.  Readings have been for the most part 130/80 at rest  but not  below that  Patient is following a reduced salt diet most days and is taking medications as prescribed      Review of Symptoms  Patient denies headache, fevers, malaise, unintentional weight loss, skin rash, eye pain, sinus congestion and sinus pain, sore throat, dysphagia,  hemoptysis , cough, dyspnea, wheezing, chest pain, palpitations, orthopnea, edema, abdominal pain, nausea, melena, diarrhea, constipation, flank pain, dysuria, hematuria, urinary  Frequency, nocturia, numbness, tingling, seizures,  Focal weakness, Loss of consciousness,  Tremor, insomnia, depression, anxiety, and suicidal ideation.    Physical Exam:  BP 125/75   Pulse 70   Ht 5' 7 (1.702 m)   Wt 178 lb 12.8 oz (81.1 kg)   LMP 05/23/2015   SpO2 98%   BMI 28.00 kg/m    Physical Exam Vitals reviewed.  Constitutional:      General: She is not in acute distress.    Appearance: Normal appearance. She is well-developed and normal weight. She is not ill-appearing, toxic-appearing or diaphoretic.  HENT:  Head: Normocephalic.     Right Ear: Tympanic membrane, ear canal and external ear normal. There is no impacted cerumen.     Left Ear: Tympanic membrane, ear canal and external ear normal. There is no impacted cerumen.     Nose: Nose normal.     Mouth/Throat:     Mouth: Mucous membranes are moist.     Pharynx: Oropharynx is clear.  Eyes:     General: No scleral icterus.       Right eye: No discharge.        Left  eye: No discharge.     Conjunctiva/sclera: Conjunctivae normal.     Pupils: Pupils are equal, round, and reactive to light.  Neck:     Thyroid: No thyromegaly.     Vascular: No carotid bruit or JVD.  Cardiovascular:     Rate and Rhythm: Normal rate and regular rhythm.     Heart sounds: Normal heart sounds.  Pulmonary:     Effort: Pulmonary effort is normal. No respiratory distress.     Breath sounds: Normal breath sounds.  Chest:  Breasts:    Breasts are symmetrical.     Right: Normal. No swelling, inverted nipple, mass, nipple discharge, skin change or tenderness.     Left: Normal. No swelling, inverted nipple, mass, nipple discharge, skin change or tenderness.  Abdominal:     General: Bowel sounds are normal.     Palpations: Abdomen is soft. There is no mass.     Tenderness: There is no abdominal tenderness. There is no guarding or rebound.  Musculoskeletal:        General: Normal range of motion.     Cervical back: Normal range of motion and neck supple.  Lymphadenopathy:     Cervical: No cervical adenopathy.     Upper Body:     Right upper body: No supraclavicular, axillary or pectoral adenopathy.     Left upper body: No supraclavicular, axillary or pectoral adenopathy.  Skin:    General: Skin is warm and dry.  Neurological:     General: No focal deficit present.     Mental Status: She is alert and oriented to person, place, and time. Mental status is at baseline.  Psychiatric:        Mood and Affect: Mood normal.        Behavior: Behavior normal.        Thought Content: Thought content normal.        Judgment: Judgment normal.     Assessment and Plan: Visit for preventive health examination Assessment & Plan: age appropriate education and counseling updated, referrals for preventative services and immunizations addressed, dietary and smoking counseling addressed, most recent labs reviewed.  I have personally reviewed and have noted:   1) the patient's medical and  social history 2) The pt's use of alcohol, tobacco, and illicit drugs 3) The patient's current medications and supplements 4) Functional ability including ADL's, fall risk, home safety risk, hearing and visual impairment 5) Diet and physical activities 6) Evidence for depression or mood disorder 7) The patient's height, weight, and BMI have been recorded in the chart   I have made referrals, and provided counseling and education based on review of the above    Encounter for screening mammogram for malignant neoplasm of breast -     3D Screening Mammogram, Left and Right; Future  Helicobacter pylori gastritis -     Omeprazole ; TAKE 1 CAPSULE BY MOUTH DAILY AS NEEDED FOR HEARTBURN ON  AN EMPTY STOMACH  Dispense: 90 capsule; Refill: 2  Essential hypertension Assessment & Plan: With cardiomegaly and ascending aortic aneursym noted on CA chest done by CTS .  BP  goal is  120/70 or less and she is not at goal on  minimal dose of amlodipine .  adding  low dose beta blocker   Orders: -     Comprehensive metabolic panel with GFR -     Microalbumin / creatinine urine ratio  Hyperlipidemia LDL goal <100 Assessment & Plan: 10 yr risk of CAD using the AHA risk calculator is  9.95 %.  Patient has  incidental evidence of mild scattered aortic atherosclerosis .  Placqiue stabilization with statin trial  was advised , accepted and tolerated  '  Orders: -     Lipid panel -     LDL cholesterol, direct  Other fatigue -     TSH -     CBC with Differential/Platelet  Impaired fasting glucose -     Comprehensive metabolic panel with GFR -     Hemoglobin A1c  Need for influenza vaccination -     Flu vaccine trivalent PF, 6mos and older(Flulaval,Afluria,Fluarix,Fluzone)  Tobacco abuse Assessment & Plan: She has reduced her smoking but not quit completely and is using wellbutrin  once daily dosing    Laryngeal cyst Assessment & Plan: found by Dr Milissa during recent ENT evaluation for  hoarseness.    Aneurysm of ascending aorta without rupture Assessment & Plan: Slight increase in diameter by serial imaging. Adding beta blocker , goal BP < 120/70   Generalized anxiety disorder with panic attacks Assessment & Plan: She reports decreased frequency of panic attacks but continues to address all projects with a moderate amoutntof anxiety. Continue  rare prn use of alprazolam  , adding metoprolol  for improved  control of blood pressure and calming effect as she has  deferred SSRI therapy   Stage 1 mild COPD by GOLD classification [facility]) Assessment & Plan: She remains asymptomatic and has semi annual follow up with pulmonology .  Encouraged to quit smoking   Respiratory bronchiolitis associated interstitial lung disease [facility]) Assessment & Plan: Noted on CT chest in 2024 .  She is aware of the damage to her lung sthat continued use of cigarettes causes and is actively engaged in quitting with gradual reduction    Other orders -     buPROPion  HCl ER (SR); Take 1 tablet (150 mg total) by mouth 2 (two) times daily.  Dispense: 180 tablet; Refill: 1 -     Metoprolol  Succinate ER; Take 1 tablet (25 mg total) by mouth daily.  Dispense: 90 tablet; Refill: 3    No follow-ups on file.  Verneita LITTIE Kettering, MD

## 2024-05-20 NOTE — Assessment & Plan Note (Addendum)
 With cardiomegaly and ascending aortic aneursym noted on CA chest done by CTS .  BP  goal is  120/70 or less and she is not at goal on  minimal dose of amlodipine .  adding  low dose beta blocker

## 2024-05-21 ENCOUNTER — Encounter: Payer: Self-pay | Admitting: Internal Medicine

## 2024-05-21 NOTE — Assessment & Plan Note (Signed)
She has reduced her smoking but not quit completely and is using wellbutrin once daily dosing

## 2024-05-21 NOTE — Assessment & Plan Note (Signed)
 She remains asymptomatic and has semi annual follow up with pulmonology .  Encouraged to quit smoking

## 2024-05-21 NOTE — Assessment & Plan Note (Signed)
 Slight increase in diameter by serial imaging. Adding beta blocker , goal BP < 120/70

## 2024-05-21 NOTE — Assessment & Plan Note (Addendum)
 She reports decreased frequency of panic attacks but continues to address all projects with a moderate amoutntof anxiety. Continue  rare prn use of alprazolam  , adding metoprolol  for improved  control of blood pressure and calming effect as she has  deferred SSRI therapy

## 2024-05-21 NOTE — Assessment & Plan Note (Signed)
 Noted on CT chest in 2024 .  She is aware of the damage to her lung sthat continued use of cigarettes causes and is actively engaged in quitting with gradual reduction

## 2024-05-21 NOTE — Assessment & Plan Note (Signed)
 10 yr risk of CAD using the AHA risk calculator is  9.95 %.  Patient has  incidental evidence of mild scattered aortic atherosclerosis .  Placqiue stabilization with statin trial  was advised , accepted and tolerated  '

## 2024-05-21 NOTE — Assessment & Plan Note (Signed)
 found by Dr Milissa during recent ENT evaluation for hoarseness.

## 2024-05-21 NOTE — Assessment & Plan Note (Signed)

## 2024-05-31 ENCOUNTER — Encounter: Payer: Self-pay | Admitting: Acute Care

## 2024-06-06 LAB — CBC WITH DIFFERENTIAL/PLATELET
Basophils Absolute: 0 x10E3/uL (ref 0.0–0.2)
Basos: 0 %
EOS (ABSOLUTE): 0.2 x10E3/uL (ref 0.0–0.4)
Eos: 3 %
Hematocrit: 41.9 % (ref 34.0–46.6)
Hemoglobin: 14.3 g/dL (ref 11.1–15.9)
Immature Grans (Abs): 0 x10E3/uL (ref 0.0–0.1)
Immature Granulocytes: 0 %
Lymphocytes Absolute: 2.2 x10E3/uL (ref 0.7–3.1)
Lymphs: 28 %
MCH: 30.6 pg (ref 26.6–33.0)
MCHC: 34.1 g/dL (ref 31.5–35.7)
MCV: 90 fL (ref 79–97)
Monocytes Absolute: 0.5 x10E3/uL (ref 0.1–0.9)
Monocytes: 6 %
Neutrophils Absolute: 5 x10E3/uL (ref 1.4–7.0)
Neutrophils: 62 %
Platelets: 251 x10E3/uL (ref 150–450)
RBC: 4.68 x10E6/uL (ref 3.77–5.28)
RDW: 13 % (ref 11.7–15.4)
WBC: 8 x10E3/uL (ref 3.4–10.8)

## 2024-06-06 LAB — COMPREHENSIVE METABOLIC PANEL WITH GFR
ALT: 21 IU/L (ref 0–32)
AST: 23 IU/L (ref 0–40)
Albumin: 4.6 g/dL (ref 3.9–4.9)
Alkaline Phosphatase: 99 IU/L (ref 49–135)
BUN/Creatinine Ratio: 12 (ref 12–28)
BUN: 11 mg/dL (ref 8–27)
Bilirubin Total: 0.6 mg/dL (ref 0.0–1.2)
CO2: 22 mmol/L (ref 20–29)
Calcium: 10 mg/dL (ref 8.7–10.3)
Chloride: 99 mmol/L (ref 96–106)
Creatinine, Ser: 0.91 mg/dL (ref 0.57–1.00)
Globulin, Total: 2.7 g/dL (ref 1.5–4.5)
Glucose: 87 mg/dL (ref 70–99)
Potassium: 3.7 mmol/L (ref 3.5–5.2)
Sodium: 138 mmol/L (ref 134–144)
Total Protein: 7.3 g/dL (ref 6.0–8.5)
eGFR: 71 mL/min/1.73 (ref >59)

## 2024-06-06 LAB — LIPID PANEL
Chol/HDL Ratio: 2.4 ratio (ref 0.0–4.4)
Cholesterol, Total: 203 mg/dL — ABNORMAL HIGH (ref 100–199)
HDL: 83 mg/dL (ref >39)
LDL Chol Calc (NIH): 107 mg/dL — ABNORMAL HIGH (ref 0–99)
Triglycerides: 75 mg/dL (ref 0–149)
VLDL Cholesterol Cal: 13 mg/dL (ref 5–40)

## 2024-06-06 LAB — MICROALBUMIN / CREATININE URINE RATIO
Creatinine, Urine: 32.5 mg/dL
Microalb/Creat Ratio: <9 mg/g{creat} (ref 0–29)
Microalbumin, Urine: <3 ug/mL

## 2024-06-06 LAB — LDL CHOLESTEROL, DIRECT: LDL Direct: 110 mg/dL — ABNORMAL HIGH (ref 0–99)

## 2024-06-06 LAB — HEMOGLOBIN A1C
Est. average glucose Bld gHb Est-mCnc: 108 mg/dL
Hgb A1c MFr Bld: 5.4 % (ref 4.8–5.6)

## 2024-06-06 LAB — TSH: TSH: 1.19 u[IU]/mL (ref 0.450–4.500)

## 2024-06-08 ENCOUNTER — Ambulatory Visit: Payer: Self-pay | Admitting: Internal Medicine

## 2024-07-24 ENCOUNTER — Ambulatory Visit: Admission: RE | Admit: 2024-07-24 | Discharge: 2024-07-24 | Attending: Internal Medicine | Admitting: Internal Medicine

## 2024-07-24 DIAGNOSIS — Z1231 Encounter for screening mammogram for malignant neoplasm of breast: Secondary | ICD-10-CM | POA: Insufficient documentation

## 2024-07-29 ENCOUNTER — Other Ambulatory Visit: Payer: Self-pay | Admitting: Internal Medicine

## 2024-07-29 DIAGNOSIS — R928 Other abnormal and inconclusive findings on diagnostic imaging of breast: Secondary | ICD-10-CM

## 2024-08-06 ENCOUNTER — Inpatient Hospital Stay: Admission: RE | Admit: 2024-08-06 | Discharge: 2024-08-06 | Attending: Internal Medicine | Admitting: Internal Medicine

## 2024-08-06 ENCOUNTER — Ambulatory Visit
Admission: RE | Admit: 2024-08-06 | Discharge: 2024-08-06 | Disposition: A | Discharge status: Home / Self Care | Source: Ambulatory Visit | Attending: Internal Medicine | Admitting: Internal Medicine

## 2024-08-06 DIAGNOSIS — R928 Other abnormal and inconclusive findings on diagnostic imaging of breast: Secondary | ICD-10-CM | POA: Insufficient documentation

## 2024-08-26 ENCOUNTER — Other Ambulatory Visit: Payer: Self-pay | Admitting: Internal Medicine

## 2025-05-23 ENCOUNTER — Encounter: Admitting: Internal Medicine
